# Patient Record
Sex: Female | Born: 2000 | Race: White | Hispanic: No | Marital: Single | Smoking: Never smoker
Health system: Southern US, Community
[De-identification: ages and names within clinical notes are randomized; demographics above are authoritative.]

---
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
## 2019-04-05 IMAGING — US US PELVIS LIMITED
1 series · 14 of 24 positions shown · non-contrast
Comparison: None.

CLINICAL DATA: 16-year-old female with pain and redness over the
coccyx.

EXAM:
Limited ultrasound of the soft tissues of the lower back and
coccygeal region.
TECHNIQUE: Targeted sonographic images of the soft tissues of the lower back in
the region of the coccyx using grayscale and color Doppler.

[Series 1: us pelvis limited · 0.07mm/px · 24 acquisitions, 14 frames shown]
[im 1/24]
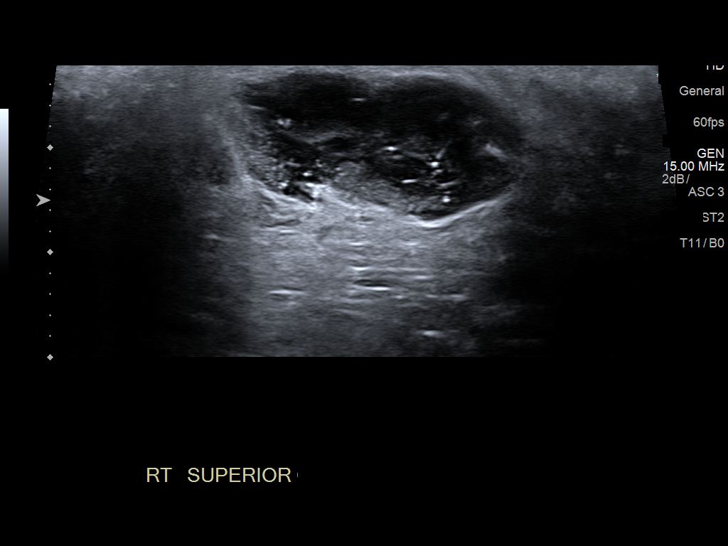
[im 3/24]
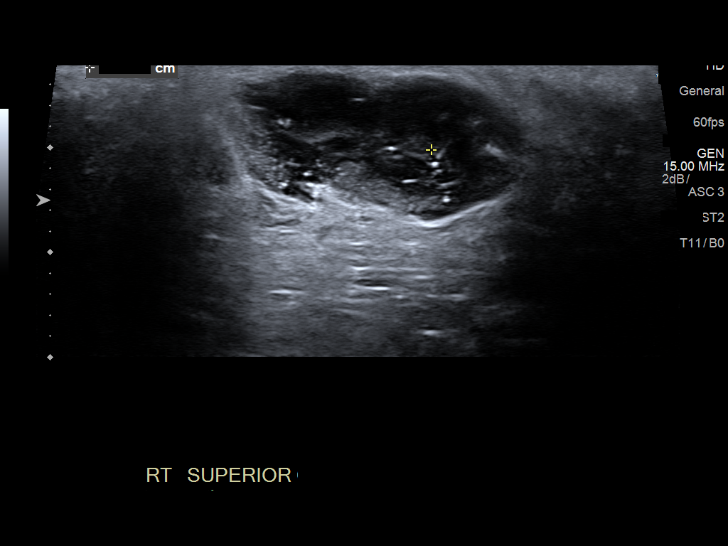
[im 5/24]
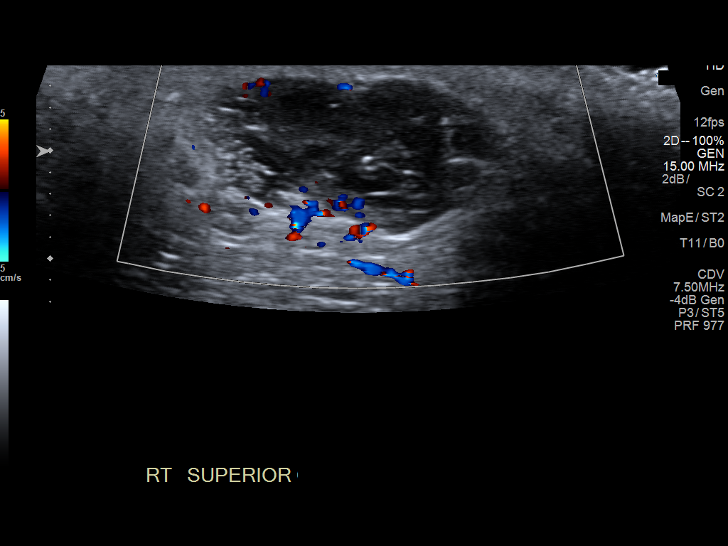
[im 7/24]
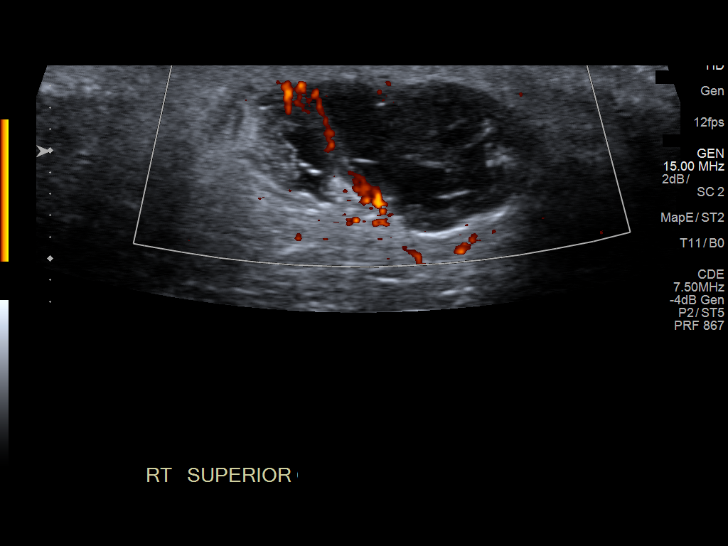
[im 8/24]
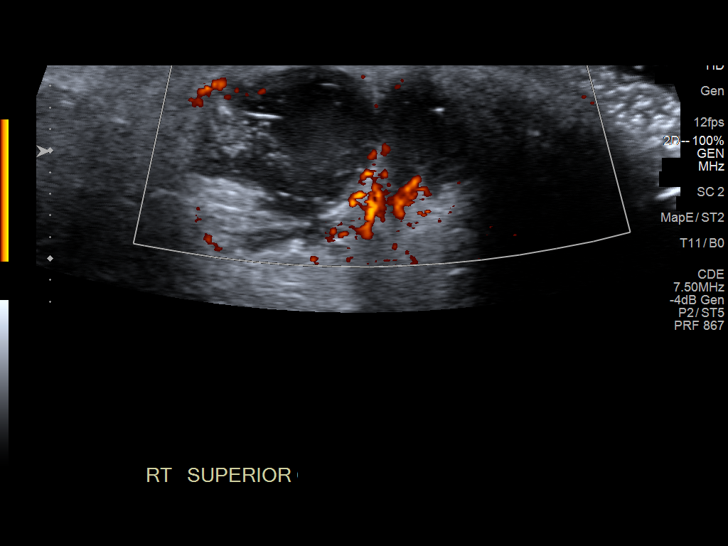
[im 10/24]
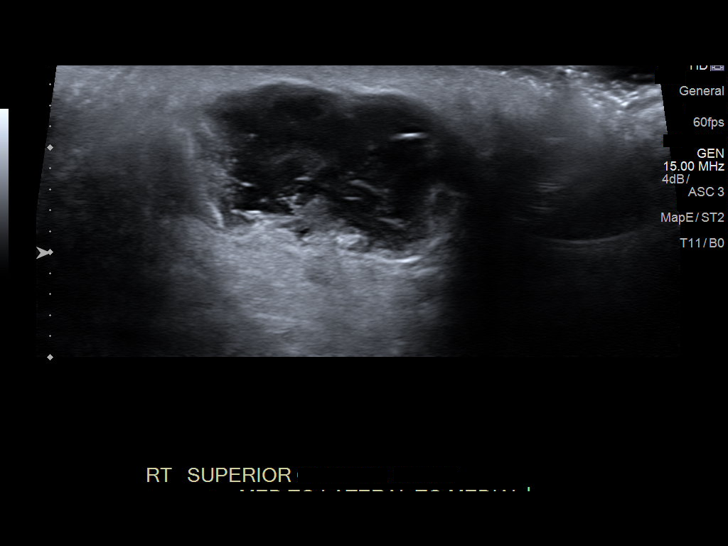
[im 12/24]
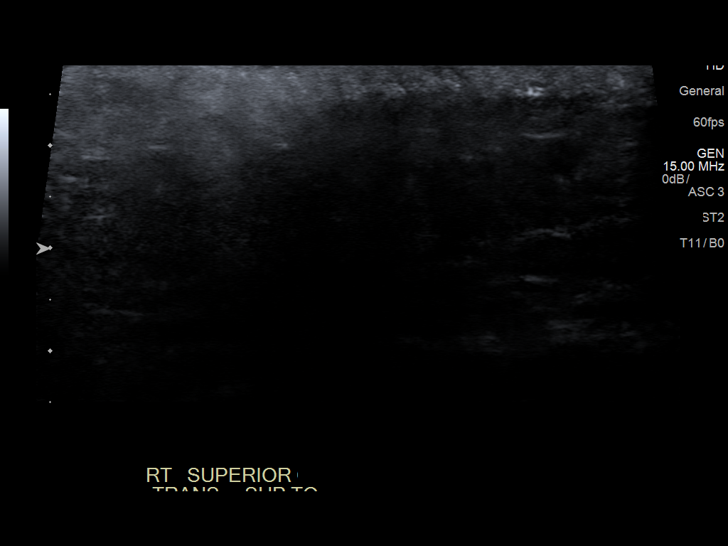
[im 13/24]
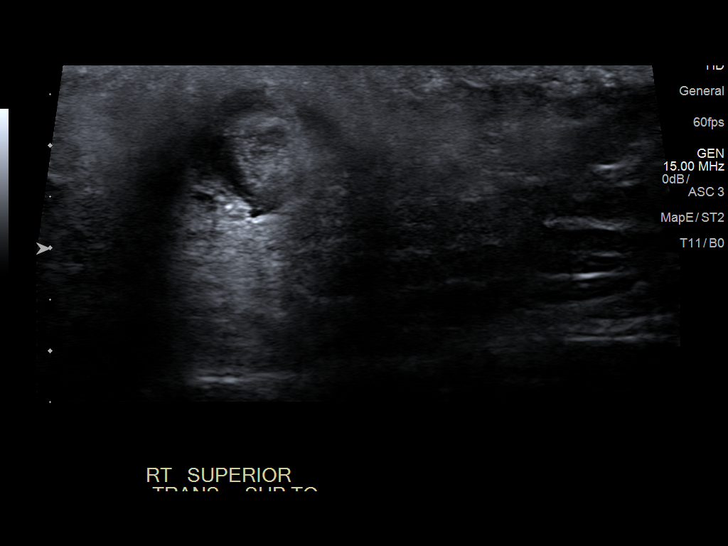
[im 15/24]
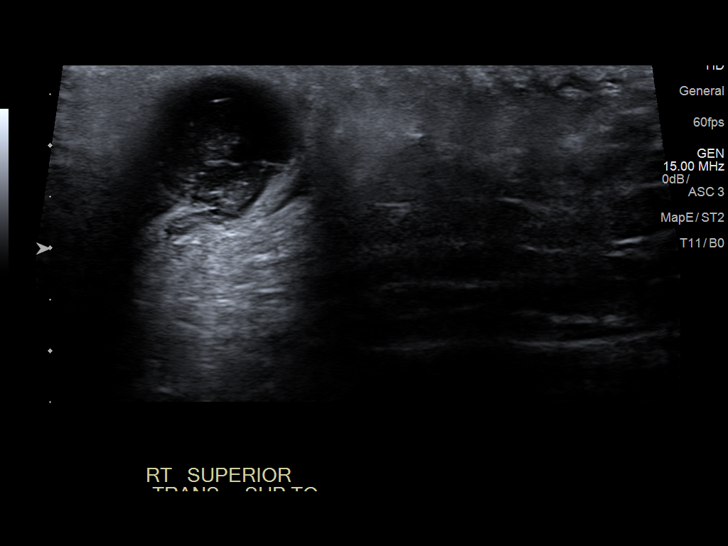
[im 17/24]
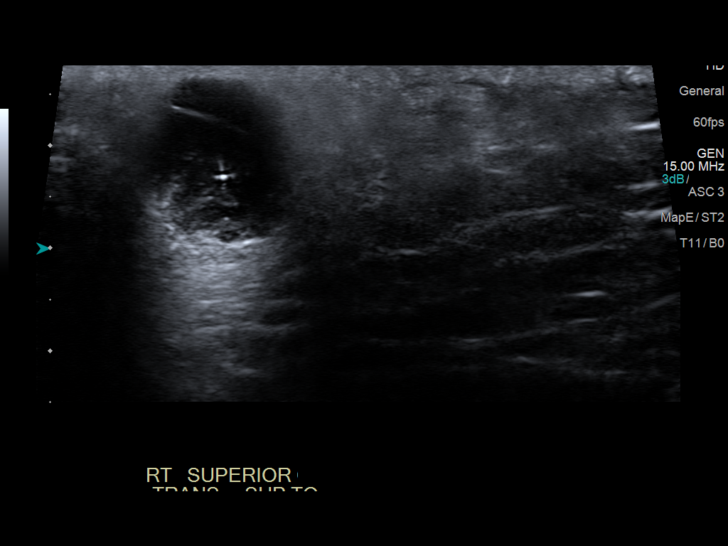
[im 19/24]
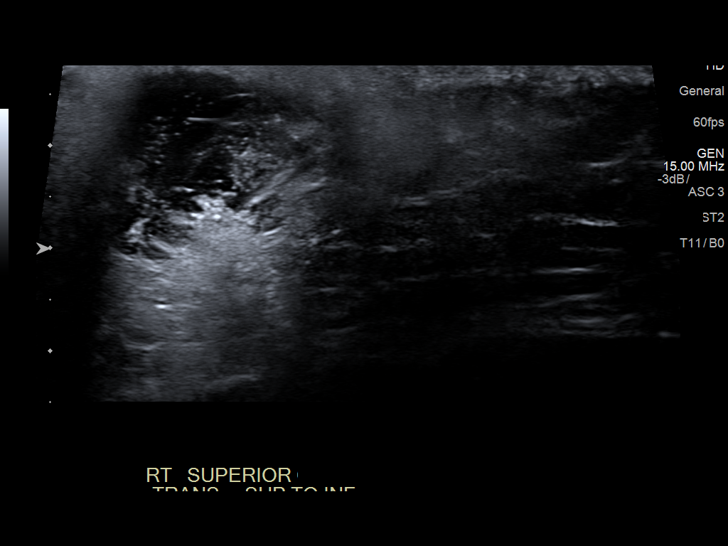
[im 20/24]
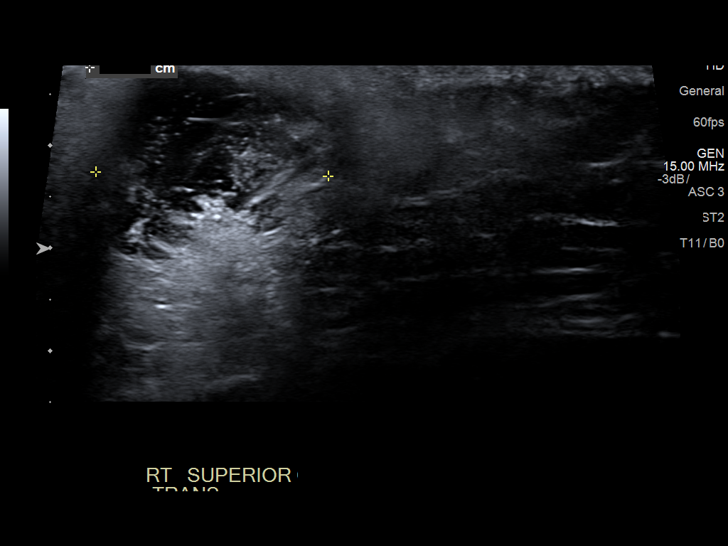
[im 22/24]
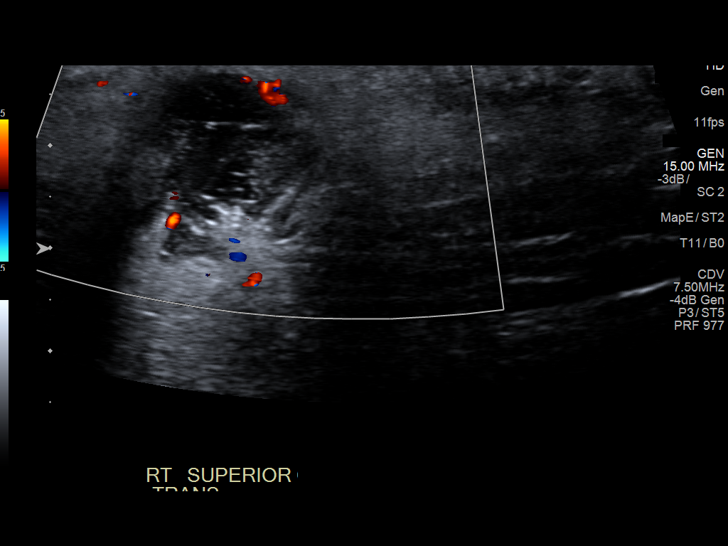
[im 24/24]
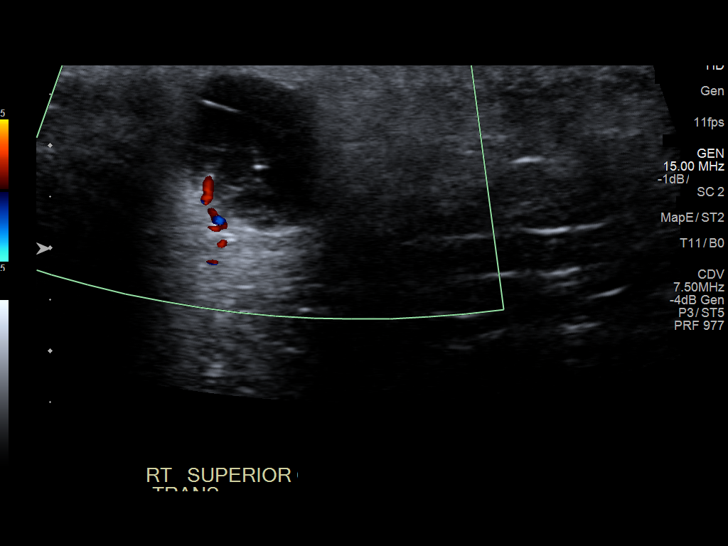

[14 of 24 positions shown; findings below may reference images not displayed]

FINDINGS: There is a 2.8 x 2.3 x 1.5 cm complex predominantly cystic structure
or complex collection in the superficial soft tissues of the lower
back superior and to the right of the to the intergluteal cleft.

Color images demonstrate hyperemia primarily to the periphery of
this collection. There is diffuse edema in the adjacent subcutaneous
soft tissues.
IMPRESSION: Complex collection in the subcutaneous soft tissues of the lower
back superior and to the right of the intergluteal fissure most
consistent with an abscess. Clinical correlation is recommended.

## 2019-04-18 DIAGNOSIS — Z03818 Encounter for observation for suspected exposure to other biological agents ruled out: Secondary | ICD-10-CM | POA: Diagnosis not present

## 2019-04-18 DIAGNOSIS — Z20828 Contact with and (suspected) exposure to other viral communicable diseases: Secondary | ICD-10-CM | POA: Diagnosis not present

## 2019-04-22 DIAGNOSIS — Z20828 Contact with and (suspected) exposure to other viral communicable diseases: Secondary | ICD-10-CM | POA: Diagnosis not present

## 2019-06-10 HISTORY — PX: OTHER SURGICAL HISTORY: SHX169

## 2019-06-16 ENCOUNTER — Other Ambulatory Visit: Payer: Self-pay

## 2019-06-16 ENCOUNTER — Ambulatory Visit (INDEPENDENT_AMBULATORY_CARE_PROVIDER_SITE_OTHER): Payer: BC Managed Care – PPO | Admitting: Obstetrics and Gynecology

## 2019-06-16 ENCOUNTER — Encounter: Payer: Self-pay | Admitting: Obstetrics and Gynecology

## 2019-06-16 VITALS — BP 110/70 | Ht 67.0 in | Wt 149.0 lb

## 2019-06-16 DIAGNOSIS — N921 Excessive and frequent menstruation with irregular cycle: Secondary | ICD-10-CM | POA: Insufficient documentation

## 2019-06-16 DIAGNOSIS — Z3009 Encounter for other general counseling and advice on contraception: Secondary | ICD-10-CM

## 2019-06-16 DIAGNOSIS — D500 Iron deficiency anemia secondary to blood loss (chronic): Secondary | ICD-10-CM | POA: Insufficient documentation

## 2019-06-16 LAB — POCT HEMOGLOBIN: Hemoglobin: 9.8 g/dL — AB (ref 11–14.6)

## 2019-06-16 NOTE — Progress Notes (Signed)
[place], [facility]   Chief Complaint  Patient presents with  . Contraception    family history of endometriosis, would like to switch BC, currently on OCP's, maybe IUD, heavy cycles and clots, a lot of cramping    HPI:      [name] is a 18 y.o. No obstetric history on file. who LMP was Patient's last menstrual period was 06/04/2019 (exact date)., presents today for NP [facility] consult. Pt has been on OCPs since 5/20. Menses are monthly, last 6 days, heavy changing super plus tampon hourly for 6 days and with large clots. Feels fatigued due to blood loss. Was anemic 5/20 with PCP, not taking iron supp. No BTB, has severe dysmen, not improved with Rx anaprox/NSAIDs. Has had to miss school/activities due to pain. Menses without OCPs last 10 days, not as heavy but cramps still severe. Pt never had dysmen until 12/19. Mom with endometriosis and pt concerned. She has had wt gain with OCPs and mood changes. Interested in different [facility] Mom had IUD and did well. No hx of HTN, DVTs, migraines.  She is sex active, using OCPs and condoms. Has pelvic and back dyspareunia.   Patient Active Problem List   Diagnosis Date Noted  . Iron deficiency anemia due to chronic blood loss 06/16/2019  . Menometrorrhagia 06/16/2019  . Pilonidal cyst with abscess 07/27/2017  . Cough 07/26/2017  . Postural dizziness with presyncope 10/20/2015  . Bronchospasm, exercise-induced 09/13/2015  . FHx: Brugada syndrome 09/13/2015    History reviewed. No pertinent surgical history.  Family History  Problem Relation Age of Onset  . Lung cancer Maternal Grandfather     Social History   Socioeconomic History  . Marital status: Unknown    Spouse name: Not on file  . Number of children: Not on file  . Years of education: Not on file  . Highest education level: Not on file  Occupational History  . Not on file  Social Needs  . Financial resource strain: Not on file  . Food insecurity    Worry: Not  on file    Inability: Not on file  . Transportation needs    Medical: Not on file    Non-medical: Not on file  Tobacco Use  . Smoking status: Never Smoker  . Smokeless tobacco: Never Used  Substance and Sexual Activity  . Alcohol use: No  . Drug use: Never  . Sexual activity: Yes    Birth control/protection: Pill  Lifestyle  . Physical activity    Days per week: Not on file    Minutes per session: Not on file  . Stress: Not on file  Relationships  . Social Musician on phone: Not on file    Gets together: Not on file    Attends religious service: Not on file    Active member of club or organization: Not on file    Attends meetings of clubs or organizations: Not on file    Relationship status: Not on file  . Intimate partner violence    Fear of current or ex partner: Not on file    Emotionally abused: Not on file    Physically abused: Not on file    Forced sexual activity: Not on file  Other Topics Concern  . Not on file  Social History Narrative  . Not on file    Outpatient Medications Prior to Visit  Medication Sig Dispense Refill  . SPRINTEC 28 0.25-35  MG-MCG tablet Take 1 tablet by mouth daily.    Marland Kitchen. docusate sodium (COLACE) 100 MG capsule Take 1 tablet once or twice daily as needed for constipation while taking narcotic pain medicine 30 capsule 0  . HYDROcodone-acetaminophen (NORCO/VICODIN) 5-325 MG tablet Take 1-2 tablets by mouth every 6 (six) hours as needed for moderate pain or severe pain. 15 tablet 0  . naproxen (NAPROSYN) 500 MG tablet Take 500 mg by mouth 2 (two) times daily with a meal.  0  . sulfamethoxazole-trimethoprim (BACTRIM DS,SEPTRA DS) 800-160 MG tablet Take 2 tablets by mouth 2 (two) times daily. 40 tablet 0   No facility-administered medications prior to visit.       ROS:  Review of Systems  Constitutional: Negative for fatigue, fever and unexpected weight change.  Respiratory: Negative for cough, shortness of breath and  wheezing.   Cardiovascular: Negative for chest pain, palpitations and leg swelling.  Gastrointestinal: Negative for blood in stool, constipation, diarrhea, nausea and vomiting.  Endocrine: Negative for cold intolerance, heat intolerance and polyuria.  Genitourinary: Positive for menstrual problem. Negative for dyspareunia, dysuria, flank pain, frequency, genital sores, hematuria, pelvic pain, urgency, vaginal bleeding, vaginal discharge and vaginal pain.  Musculoskeletal: Negative for back pain, joint swelling and myalgias.  Skin: Negative for rash.  Neurological: Negative for dizziness, syncope, light-headedness, numbness and headaches.  Hematological: Negative for adenopathy.  Psychiatric/Behavioral: Negative for agitation, confusion, sleep disturbance and suicidal ideas. The patient is not nervous/anxious.    BREAST: No symptoms   OBJECTIVE:   Vitals:  BP 110/70   Ht 5\' 7"  (1.702 m)   Wt 149 lb (67.6 kg)   LMP 06/04/2019 (Exact Date)   BMI 23.34 kg/m   Physical Exam Vitals signs reviewed.  Constitutional:      Appearance: She is well-developed.  Neck:     Musculoskeletal: Normal range of motion.  Pulmonary:     Effort: Pulmonary effort is normal.  Musculoskeletal: Normal range of motion.  Skin:    General: Skin is warm and dry.  Neurological:     General: No focal deficit present.     Mental Status: She is alert and oriented to person, place, and time.     Cranial Nerves: No cranial nerve deficit.  Psychiatric:        Mood and Affect: Mood normal.        Behavior: Behavior normal.        Thought Content: Thought content normal.        Judgment: Judgment normal.     Results: Results for orders placed or performed in visit on 06/16/19 (from the past 24 hour(s))  POCT hemoglobin     Status: Abnormal   Collection Time: 06/16/19  4:03 PM  Result Value Ref Range   Hemoglobin 9.8 (A) 11 - 14.6 g/dL     Assessment/Plan: Menometrorrhagia - Plan: POCT hemoglobin; Pt  with anemia. Sx worse on sprintec. Discussed different OCPs, depo, and IUD for cycle control. Discussed endometriosis and dx lap to dx.Would check GYN u/s first if can't get sx under control with BC. Pt to discuss options with mom and f/u with decision. Would start with next menses. NSAIDs 2-3 days before menses this cycle for dysmen improvement.   Encounter for other general counseling or advice on contraception--options discussed. [facility] F/u with menses for insertion/cytotec prn vs OCP change.   Iron deficiency anemia due to chronic blood loss--add MVI with Fe.    Return if symptoms worsen or fail to  improve.  Rayden Dock B. Sandhya Denherder, PA-C 06/16/2019 7:53 PM

## 2019-06-16 NOTE — Patient Instructions (Signed)
I value your feedback and entrusting us with your care. If you get a [place] [facility] patient survey, I would appreciate you taking the time to let us know about your experience today. Thank you! 

## 2019-06-19 ENCOUNTER — Encounter: Payer: Self-pay | Admitting: Obstetrics and Gynecology

## 2019-06-20 ENCOUNTER — Telehealth: Payer: Self-pay | Admitting: Obstetrics and Gynecology

## 2019-06-20 NOTE — Telephone Encounter (Signed)
Patient is schedule for 07/02/19 with [facility] for [facility] placement

## 2019-06-24 NOTE — Telephone Encounter (Signed)
Noted. Kyleena reserved for this patient. 

## 2019-07-02 ENCOUNTER — Other Ambulatory Visit: Payer: Self-pay | Admitting: Obstetrics and Gynecology

## 2019-07-02 ENCOUNTER — Encounter: Payer: BC Managed Care – PPO | Admitting: Obstetrics and Gynecology

## 2019-07-02 ENCOUNTER — Other Ambulatory Visit: Payer: Self-pay

## 2019-07-02 ENCOUNTER — Encounter: Payer: Self-pay | Admitting: Obstetrics and Gynecology

## 2019-07-02 DIAGNOSIS — Z30014 Encounter for initial prescription of intrauterine contraceptive device: Secondary | ICD-10-CM

## 2019-07-02 MED ORDER — MISOPROSTOL 100 MCG PO TABS: 100.0000 ug | ORAL_TABLET | Freq: Once | ORAL | 0 refills | Status: DC

## 2019-07-02 NOTE — Progress Notes (Signed)
Rx cytotec to take 1 hr before iUD appt.

## 2019-07-02 NOTE — Telephone Encounter (Signed)
Patient rescheduled to 12/30 with [facility]

## 2019-07-03 NOTE — Patient Instructions (Signed)
I value your feedback and entrusting us with your care. If you get a [facility] patient survey, I would appreciate you taking the time to let us know about your experience today. Thank you!  As of June 19, 2019, your lab results will be released to your MyChart immediately, before I even have a chance to see them. Please give me time to review them and contact you if there are any abnormalities. Thank you for your patience.  

## 2019-07-03 NOTE — Progress Notes (Signed)
This encounter was created in error - please disregard.

## 2019-07-08 NOTE — Progress Notes (Signed)
   Chief Complaint  Patient presents with  . Contraception    [name]     [name]:  Patricia Schwartz is a 18 y.o. G0P0000 here for [facility]  IUD insertion for [facility] and menorrhagia. Was on OCPs without sx control. Wanted to try IUD.   BP 110/72 (BP Location: Left Arm, Patient Position: Sitting, Cuff Size: Normal)   Pulse 92   Ht 5\' 7"  (1.702 m)   Wt 148 lb (67.1 kg)   LMP 07/02/2019   BMI 23.18 kg/m   IUD Insertion Procedure Note Patient identified, informed consent performed, consent signed.   Discussed risks of irregular bleeding, cramping, infection, malpositioning or misplacement of the IUD outside the uterus which may require further procedure such as laparoscopy, risk of failure <1%. Time out was performed.    Speculum placed in the vagina.  Cervix visualized.  Cleaned with Betadine x 2.  Grasped anteriorly with a single tooth tenaculum.  Uterus sounded to 7.0 cm.   IUD placed per manufacturer's recommendations.  Strings trimmed to 3 cm. Tenaculum was removed, good hemostasis noted.  Patient tolerated procedure well.   ASSESSMENT:  Encounter for insertion of intrauterine contraceptive device (IUD) - Plan: Levonorgestrel (KYLEENA) 19.5 MG IUD   Meds ordered this encounter  Medications  . Levonorgestrel (KYLEENA) 19.5 MG IUD    Sig: 1 each (19.5 mg total) by Intrauterine route once for 1 dose.    Dispense:  1 Intra Uterine Device    Refill:  0    Order Specific Question:   Supervising Provider    Answer:   Gae Dry [[id number]]     Plan:  Patient was given post-procedure instructions.  She was advised to have backup contraception for one week.   Call if you are having increasing pain, cramps or bleeding or if you have a fever greater than 100.4 degrees F., shaking chills, nausea or vomiting. Patient was also asked to check IUD strings periodically and follow up in 4 weeks for IUD check.  Return in about 4 weeks (around 08/06/2019) for IUD  f/u.  Mylani Gentry B. Westlee Devita, PA-C 07/09/2019 12:32 PM

## 2019-07-08 NOTE — Telephone Encounter (Signed)
Noted  

## 2019-07-09 ENCOUNTER — Encounter: Payer: Self-pay | Admitting: Obstetrics and Gynecology

## 2019-07-09 ENCOUNTER — Other Ambulatory Visit: Payer: Self-pay

## 2019-07-09 ENCOUNTER — Ambulatory Visit (INDEPENDENT_AMBULATORY_CARE_PROVIDER_SITE_OTHER): Payer: BC Managed Care – PPO | Admitting: Obstetrics and Gynecology

## 2019-07-09 VITALS — BP 110/72 | HR 92 | Ht 67.0 in | Wt 148.0 lb

## 2019-07-09 DIAGNOSIS — Z30430 Encounter for insertion of intrauterine contraceptive device: Secondary | ICD-10-CM | POA: Diagnosis not present

## 2019-07-09 MED ORDER — KYLEENA 19.5 MG IU IUD: 19.5000 mg | INTRAUTERINE_SYSTEM | Freq: Once | INTRAUTERINE | 0 refills | Status: DC

## 2019-07-09 NOTE — Patient Instructions (Signed)
I value your feedback and entrusting us with your care. If you get a [facility] patient survey, I would appreciate you taking the time to let us know about your experience today. Thank you!  As of June 19, 2019, your lab results will be released to your MyChart immediately, before I even have a chance to see them. Please give me time to review them and contact you if there are any abnormalities. Thank you for your patience.   [facility] [phone]  Instructions after IUD insertion  Most women experience no significant problems after insertion of an IUD, however minor cramping and spotting for a few days is common. Cramps may be treated with ibuprofen 800mg every 8 hours or Tylenol 650 mg every 4 hours. Contact [facility] immediately if you experience any of the following symptoms during the next week: temperature >99.6 degrees, worsening pelvic pain, abdominal pain, fainting, unusually heavy vaginal bleeding, foul vaginal discharge, or if you think you have expelled the IUD.  Nothing inserted in the vagina for 48 hours. You will be scheduled for a follow up visit in approximately four weeks.  You should check monthly to be sure you can feel the IUD strings in the upper vagina. If you are having a monthly period, try to check after each period. If you cannot feel the IUD strings,  contact [facility] immediately so we can do an exam to determine if the IUD has been expelled.   Please use backup protection until we can confirm the IUD is in place.  Call [facility] if you are exposed to or diagnosed with a sexually transmitted infection, as we will need to discuss whether it is safe for you to continue using an IUD.   

## 2019-07-15 ENCOUNTER — Encounter: Payer: Self-pay | Admitting: Obstetrics and Gynecology

## 2019-07-25 DIAGNOSIS — R05 Cough: Secondary | ICD-10-CM | POA: Diagnosis not present

## 2019-08-05 NOTE — Progress Notes (Deleted)
   No chief complaint on file.    History of Present Illness:  Patricia Schwartz is a 19 y.o. that had a Palau IUD placed approximately 1 month ago. Since that time, she denies dyspareunia, pelvic pain, non-menstrual bleeding, vaginal d/c, heavy bleeding.   Review of Systems  Physical Exam:  There were no vitals taken for this visit. There is no height or weight on file to calculate BMI.  Pelvic exam:  Two IUD strings {DESC; PRESENT/ABSENT:17923::"present"} seen coming from the cervical os. EGBUS, vaginal vault and cervix: within normal limits   Assessment:   No diagnosis found.  IUD strings present in proper location; pt doing well  Plan: F/u if any signs of infection or can no longer feel the strings.   Yailen Zemaitis B. Minie Roadcap, PA-C 08/05/2019 4:04 PM

## 2019-08-06 ENCOUNTER — Ambulatory Visit: Payer: BC Managed Care – PPO | Admitting: Obstetrics and Gynecology

## 2019-08-07 ENCOUNTER — Other Ambulatory Visit: Payer: Self-pay

## 2019-08-07 ENCOUNTER — Ambulatory Visit (INDEPENDENT_AMBULATORY_CARE_PROVIDER_SITE_OTHER): Payer: BC Managed Care – PPO | Admitting: Obstetrics and Gynecology

## 2019-08-07 ENCOUNTER — Encounter: Payer: Self-pay | Admitting: Obstetrics and Gynecology

## 2019-08-07 VITALS — BP 100/60 | Ht 66.0 in | Wt 150.0 lb

## 2019-08-07 DIAGNOSIS — Z30431 Encounter for routine checking of intrauterine contraceptive device: Secondary | ICD-10-CM | POA: Diagnosis not present

## 2019-08-07 NOTE — Patient Instructions (Signed)
I value your feedback and entrusting us with your care. If you get a  patient survey, I would appreciate you taking the time to let us know about your experience today. Thank you!  As of June 19, 2019, your lab results will be released to your MyChart immediately, before I even have a chance to see them. Please give me time to review them and contact you if there are any abnormalities. Thank you for your patience.  

## 2019-08-07 NOTE — Progress Notes (Signed)
   Chief Complaint  Patient presents with  . IUD check     History of Present Illness:  Patricia Schwartz is a 19 y.o. that had a Palau IUD placed approximately 1 month ago. Since that time, she has had almost daily light bleeding/spotting, and then had lighter menses last wk. Had mild cramping initially after insertion, but stopped after 4 days. No dyspareunia.  Review of Systems  Constitutional: Negative for fever.  Gastrointestinal: Negative for blood in stool, constipation, diarrhea, nausea and vomiting.  Genitourinary: Negative for dyspareunia, dysuria, flank pain, frequency, hematuria, urgency, vaginal bleeding, vaginal discharge and vaginal pain.  Musculoskeletal: Negative for back pain.  Skin: Negative for rash.    Physical Exam:  BP 100/60   Ht 5\' 6"  (1.676 m)   Wt 150 lb (68 kg)   BMI 24.21 kg/m  Body mass index is 24.21 kg/m.  Pelvic exam:  Two IUD strings present seen coming from the cervical os. EGBUS, vaginal vault and cervix: within normal limits   Assessment:   Encounter for routine checking of intrauterine contraceptive device (IUD)  IUD strings present in proper location; pt doing well  Plan: F/u if any signs of infection or can no longer feel the strings.   Rogene Meth B. Ezekial Arns, PA-C 08/07/2019 11:19 AM

## 2019-09-17 NOTE — Telephone Encounter (Signed)
Patricia Schwartz rcvd/charged 07/09/2019

## 2019-10-02 DIAGNOSIS — D225 Melanocytic nevi of trunk: Secondary | ICD-10-CM | POA: Diagnosis not present

## 2019-10-02 DIAGNOSIS — D224 Melanocytic nevi of scalp and neck: Secondary | ICD-10-CM | POA: Diagnosis not present

## 2019-10-02 DIAGNOSIS — L2089 Other atopic dermatitis: Secondary | ICD-10-CM | POA: Diagnosis not present

## 2019-10-11 ENCOUNTER — Encounter: Payer: Self-pay | Admitting: Obstetrics and Gynecology

## 2019-10-11 DIAGNOSIS — N921 Excessive and frequent menstruation with irregular cycle: Secondary | ICD-10-CM

## 2019-10-11 DIAGNOSIS — Z30431 Encounter for routine checking of intrauterine contraceptive device: Secondary | ICD-10-CM

## 2019-10-16 ENCOUNTER — Ambulatory Visit (INDEPENDENT_AMBULATORY_CARE_PROVIDER_SITE_OTHER): Payer: BC Managed Care – PPO

## 2019-10-16 ENCOUNTER — Other Ambulatory Visit: Payer: Self-pay

## 2019-10-16 DIAGNOSIS — R102 Pelvic and perineal pain: Secondary | ICD-10-CM | POA: Diagnosis not present

## 2019-10-16 DIAGNOSIS — Z30431 Encounter for routine checking of intrauterine contraceptive device: Secondary | ICD-10-CM | POA: Diagnosis not present

## 2019-10-16 DIAGNOSIS — Z975 Presence of (intrauterine) contraceptive device: Secondary | ICD-10-CM

## 2019-10-16 DIAGNOSIS — N921 Excessive and frequent menstruation with irregular cycle: Secondary | ICD-10-CM

## 2019-10-16 NOTE — Progress Notes (Signed)
Called pt, no answer, LVMTRC. 

## 2019-10-16 NOTE — Progress Notes (Signed)
Pt will give it more time

## 2019-12-29 ENCOUNTER — Encounter: Payer: Self-pay | Admitting: Obstetrics and Gynecology

## 2020-01-07 ENCOUNTER — Other Ambulatory Visit: Payer: Self-pay

## 2020-01-07 ENCOUNTER — Ambulatory Visit: Payer: BC Managed Care – PPO | Admitting: Obstetrics and Gynecology

## 2020-01-07 ENCOUNTER — Encounter: Payer: Self-pay | Admitting: Obstetrics and Gynecology

## 2020-01-07 ENCOUNTER — Other Ambulatory Visit (HOSPITAL_COMMUNITY)
Admission: RE | Admit: 2020-01-07 | Discharge: 2020-01-07 | Disposition: A | Discharge status: Home / Self Care | Payer: BC Managed Care – PPO | Source: Ambulatory Visit | Attending: Obstetrics and Gynecology | Admitting: Obstetrics and Gynecology

## 2020-01-07 VITALS — BP 126/67 | HR 81 | Ht 66.0 in | Wt 150.0 lb

## 2020-01-07 DIAGNOSIS — Z113 Encounter for screening for infections with a predominantly sexual mode of transmission: Secondary | ICD-10-CM | POA: Insufficient documentation

## 2020-01-07 DIAGNOSIS — Z30431 Encounter for routine checking of intrauterine contraceptive device: Secondary | ICD-10-CM | POA: Diagnosis not present

## 2020-01-07 MED ORDER — LO LOESTRIN FE 1 MG-10 MCG / 10 MCG PO TABS: 1.0000 | ORAL_TABLET | Freq: Every day | ORAL | 3 refills | Status: DC

## 2020-01-07 NOTE — Progress Notes (Signed)
Obstetrics & Gynecology Office Visit   Chief Complaint:  Chief Complaint  Patient presents with  . Menstrual Problem    Patricia Schwartz inserted 06/2019, cramping, heavy bleeding    History of Present Illness: 19 y.o. patient presenting for follow up of Patricia Schwartz placement 07/09/2019 by Levin Erp, PA.  The indication for her Schwartz was contraception.  She admits to any complications since her Schwartz placement.  Still having some occasional spotting, significant cramping.  She is able to feel strings.  Placement has been previously confirmed by [facility] 10/16/2019  Review of Systems: Review of Systems  Constitutional: Negative.   Gastrointestinal: Positive for abdominal pain. Negative for constipation, diarrhea, nausea and vomiting.  Genitourinary: Negative.     Past Medical History:  Past Medical History:  Diagnosis Date  . Anemia 11/2018  . Pilonidal abscess 2019    Past Surgical History:  Past Surgical History:  Procedure Laterality Date  . Patricia insertion  06/2019    Gynecologic History: No LMP recorded. (Menstrual status: Schwartz).  Obstetric History: G0P0000  Family History:  Family History  Problem Relation Age of Onset  . Lung cancer Maternal Grandfather     Social History:  Social History   Socioeconomic History  . Marital status: Unknown    Spouse name: Not on file  . Number of children: Not on file  . Years of education: Not on file  . Highest education level: Not on file  Occupational History  . Not on file  Tobacco Use  . Smoking status: Never Smoker  . Smokeless tobacco: Never Used  Vaping Use  . Vaping Use: Never used  Substance and Sexual Activity  . Alcohol use: No  . Drug use: Never  . Sexual activity: Yes    Birth control/protection: I.U.D.    Comment: Patricia  Other Topics Concern  . Not on file  Social History Narrative  . Not on file   Social Determinants of Health   Financial Resource Strain:   . Difficulty of Paying Living  Expenses:   Food Insecurity:   . Worried About Programme researcher, broadcasting/film/video in the Last Year:   . Barista in the Last Year:   Transportation Needs:   . Freight forwarder (Medical):   Marland Kitchen Lack of Transportation (Non-Medical):   Physical Activity:   . Days of Exercise per Week:   . Minutes of Exercise per Session:   Stress:   . Feeling of Stress :   Social Connections:   . Frequency of Communication with Friends and Family:   . Frequency of Social Gatherings with Friends and Family:   . Attends Religious Services:   . Active Member of Clubs or Organizations:   . Attends Banker Meetings:   Marland Kitchen Marital Status:   Intimate Partner Violence:   . Fear of Current or Ex-Partner:   . Emotionally Abused:   Marland Kitchen Physically Abused:   . Sexually Abused:     Allergies:  No Known Allergies  Medications: Prior to Admission medications   Medication Sig Start Date End Date Taking? Authorizing Provider  albuterol (VENTOLIN HFA) 108 (90 Base) MCG/ACT inhaler Inhale 2 puffs into the lungs. 07/25/19   [provider]  Levonorgestrel (Patricia) 19.5 MG Schwartz 1 each (19.5 mg total) by Intrauterine route once for 1 dose. 07/09/19 07/09/19  Copland, Ilona Sorrel, PA-C    Physical Exam There were no vitals taken for this visit. No LMP recorded. (Menstrual status: Schwartz).  General: NAD HEENT: normocephalic, anicteric Pulmonary: No increased work of breathing  Genitourinary:  External: Normal external female genitalia.  Normal urethral meatus, normal  Bartholin's and Skene's glands.    Vagina: Normal vaginal mucosa, no evidence of prolapse.    Cervix: Grossly normal in appearance, no bleeding, Schwartz strings visualized 2cm  Uterus: Non-enlarged, mobile, normal contour.  No CMT  Adnexa: ovaries non-enlarged, no adnexal masses  Rectal: deferred  Lymphatic: no evidence of inguinal lymphadenopathy Extremities: no edema, erythema, or tenderness Neurologic: Grossly intact Psychiatric: mood  appropriate, affect full  Female chaperone present for pelvic and breast  portions of the physical exam  No results found.   GYNECOLOGY OFFICE PROCEDURE NOTE  Patricia Schwartz is a 19 y.o. G0P0000 here for Mirena Schwartz removal secondary to continued pelvic pain and discomfort.  Schwartz Removal  Patient identified, informed consent performed, consent signed.  Patient was in the dorsal lithotomy position, normal external genitalia was noted.  A speculum was placed in the patient's vagina, normal discharge was noted, no lesions. The cervix was visualized, no lesions, no abnormal discharge.  The strings of the Schwartz were grasped and pulled using ring forceps. The Schwartz was removed in its entirety.  Patient tolerated the procedure well.    Patient will useOCP for contraception.  Routine preventative health maintenance measures emphasized.  Assessment: 19 y.o. G0P0000 Schwartz removal initiation of oral contraceptive  Plan: Problem List Items Addressed This Visit    None    Visit Diagnoses    Schwartz check up    -  Primary   Routine screening for STI (sexually transmitted infection)       Relevant Orders   Cervicovaginal ancillary only (Completed)       1.  Schwartz removed given continued irregular bleeding and pelvic pain.  Patient opts to change to OCP 2 month sample of LoLestrin.   - GC/CT culture - mom has history of stage IV endometriosis - no contraindication to OCP  2.  She will return for a annual exam in 1 year.  All questions answered.  3. A total of 15 minutes were spent in face-to-face contact with the patient during this encounter with over half of that time devoted to counseling and coordination of care.  4. Return in about 6 months (around 07/08/2020) for annual.   Vena Austria, MD, [facility], [facility] 01/07/2020, 8:17 AM

## 2020-01-08 LAB — CERVICOVAGINAL ANCILLARY ONLY
Chlamydia: NEGATIVE
Comment: NEGATIVE
Comment: NEGATIVE
Comment: NORMAL
Neisseria Gonorrhea: NEGATIVE
Trichomonas: NEGATIVE

## 2020-02-10 ENCOUNTER — Telehealth: Payer: Self-pay

## 2020-02-10 NOTE — Telephone Encounter (Signed)
Pt calling; was given samples of Lo Estrin; would like rx called in to [facility] [place].  [phone]  Left detailed msg that a prescription has already been sent in.  To contact pharm and have them get it ready for her.

## 2020-02-27 ENCOUNTER — Telehealth: Payer: Self-pay

## 2020-02-27 NOTE — Telephone Encounter (Signed)
Pt called stating she has new insurance and her OCP is going to cost her $550. She is asking for samples until she can get the insurance stuff straightened out.   2 months of Lo Loestrin was given

## 2020-03-04 DIAGNOSIS — J358 Other chronic diseases of tonsils and adenoids: Secondary | ICD-10-CM | POA: Diagnosis not present

## 2020-03-04 DIAGNOSIS — J351 Hypertrophy of tonsils: Secondary | ICD-10-CM | POA: Diagnosis not present

## 2020-03-19 ENCOUNTER — Telehealth: Payer: Self-pay

## 2020-03-19 NOTE — Telephone Encounter (Signed)
Patient returning missed call. Please advise  

## 2020-03-19 NOTE — Telephone Encounter (Signed)
Pt states no missed/late pills; is about to start the 3rd wk of this pack; states she did not have a period nor any sign of a period sxs in July or August; adv the lining didn't grow so there wasn't anything to have a period with.  Adv will send this to [facility].

## 2020-03-19 NOTE — Telephone Encounter (Signed)
Pt calling; IUD removed in June; has questions for [facility]MS; has had no period until naw and it's not her period week.  [phone]  LMTC  Pt now on OCPs; Late taking any or any missed pills?

## 2020-06-28 DIAGNOSIS — Z1331 Encounter for screening for depression: Secondary | ICD-10-CM | POA: Diagnosis not present

## 2020-06-28 DIAGNOSIS — F411 Generalized anxiety disorder: Secondary | ICD-10-CM | POA: Diagnosis not present

## 2020-06-28 DIAGNOSIS — Z1389 Encounter for screening for other disorder: Secondary | ICD-10-CM | POA: Diagnosis not present

## 2020-09-22 ENCOUNTER — Ambulatory Visit: Payer: BC Managed Care – PPO | Admitting: Obstetrics and Gynecology

## 2020-09-22 NOTE — Progress Notes (Deleted)
Gynecology Annual Exam  PCP: [facility]  Chief Complaint: No chief complaint on file.   History of Present Illness: Patient is a 20 y.o. G0P0000 presents for annual exam. The patient has no complaints today.   LMP: No LMP recorded. (Menstrual status: IUD). Menarche:{numbers 1-61:09604} Average Interval: {Desc; regular/irreg:14544}, {numbers 22-35:14824} days Duration of flow: {numbers; 0-10:33138} days Heavy Menses: {yes/no:63} Clots: {yes/no:63} Intermenstrual Bleeding: {yes/no:63} Postcoital Bleeding: {yes/no:63} Dysmenorrhea: {yes/no:63}  The patient {sys sexually active:13135} sexually active. She currently uses OCP (estrogen/progesterone) for contraception. She {has/denies:315300} dyspareunia.  The patient {DOES_DOES VWU:98119} perform self breast exams.  There {is/is no:19420} notable family history of breast or ovarian cancer in her family.  The patient wears seatbelts: {yes/no:63}.  The patient has regular exercise: {yes/no/not asked:9010}.    The patient {Blank single:19197::"repots","denies"} current symptoms of depression.    Review of Systems: Review of Systems  Constitutional: Negative for chills and fever.  HENT: Negative for congestion.   Respiratory: Negative for cough and shortness of breath.   Cardiovascular: Negative for chest pain and palpitations.  Gastrointestinal: Negative for abdominal pain, constipation, diarrhea, heartburn, nausea and vomiting.  Genitourinary: Negative for dysuria, frequency and urgency.  Skin: Negative for itching and rash.  Neurological: Negative for dizziness and headaches.  Endo/Heme/Allergies: Negative for polydipsia.  Psychiatric/Behavioral: Negative for depression.    Past Medical History:  Patient Active Problem List   Diagnosis Date Noted  . Iron deficiency anemia due to chronic blood loss 06/16/2019  . Menometrorrhagia 06/16/2019  . Pilonidal cyst with abscess 07/27/2017  . Cough 07/26/2017  .  Postural dizziness with presyncope 10/20/2015  . Bronchospasm, exercise-induced 09/13/2015  . FHx: Brugada syndrome 09/13/2015    Past Surgical History:  Past Surgical History:  Procedure Laterality Date  . Kyleena insertion  06/2019    Gynecologic History:  No LMP recorded. (Menstrual status: IUD). Contraception: OCP (estrogen/progesterone) Last Pap: Results were: N/A <21  Obstetric History: G0P0000  Family History:  Family History  Problem Relation Age of Onset  . Lung cancer Maternal Grandfather     Social History:  Social History   Socioeconomic History  . Marital status: Unknown    Spouse name: Not on file  . Number of children: Not on file  . Years of education: Not on file  . Highest education level: Not on file  Occupational History  . Not on file  Tobacco Use  . Smoking status: Never Smoker  . Smokeless tobacco: Never Used  Vaping Use  . Vaping Use: Never used  Substance and Sexual Activity  . Alcohol use: No  . Drug use: Never  . Sexual activity: Yes    Birth control/protection: I.U.D.    Comment: Kyleena  Other Topics Concern  . Not on file  Social History Narrative  . Not on file   Social Determinants of Health   Financial Resource Strain: Not on file  Food Insecurity: Not on file  Transportation Needs: Not on file  Physical Activity: Not on file  Stress: Not on file  Social Connections: Not on file  Intimate Partner Violence: Not on file    Allergies:  No Known Allergies  Medications: Prior to Admission medications   Medication Sig Start Date End Date Taking? Authorizing Provider  albuterol (VENTOLIN HFA) 108 (90 Base) MCG/ACT inhaler Inhale 2 puffs into the lungs. 07/25/19   [provider]  Norethindrone-Ethinyl Estradiol-Fe Biphas (LO LOESTRIN FE) 1 MG-10 MCG / 10 MCG tablet Take 1 tablet by mouth  daily. 01/07/20 03/31/20  Vena Austria, MD    Physical Exam Vitals: ***  General: NAD HEENT: normocephalic,  anicteric Thyroid: no enlargement, no palpable nodules Pulmonary: No increased work of breathing, CTAB Cardiovascular: RRR, distal pulses 2+ Breast: Breast symmetrical, no tenderness, no palpable nodules or masses, no skin or nipple retraction present, no nipple discharge.  No axillary or supraclavicular lymphadenopathy. Abdomen: NABS, soft, non-tender, non-distended.  Umbilicus without lesions.  No hepatomegaly, splenomegaly or masses palpable. No evidence of hernia  Genitourinary:  External: Normal external female genitalia.  Normal urethral meatus, normal Bartholin's and Skene's glands.    Vagina: Normal vaginal mucosa, no evidence of prolapse.    Cervix: Grossly normal in appearance, no bleeding  Uterus: Non-enlarged, mobile, normal contour.  No CMT  Adnexa: ovaries non-enlarged, no adnexal masses  Rectal: deferred  Lymphatic: no evidence of inguinal lymphadenopathy Extremities: no edema, erythema, or tenderness Neurologic: Grossly intact Psychiatric: mood appropriate, affect full  Female chaperone present for pelvic and breast  portions of the physical exam    Assessment: 20 y.o. G0P0000 routine annual exam  Plan: Problem List Items Addressed This Visit   None     1) 4) Gardasil Series discussed and if applicable offered to patient - Patient {HAS HAS BOF:75102} previously completed 3 shot series   2) STI screening  {Blank single:19197::"was","was not"}offered and {Blank single:19197::"accepted","declined","therefore not obtained"}  3)  ASCCP guidelines and rational discussed.  Start routine screening age 67, every three years.  4) Contraception - the patient is currently using  OCP (estrogen/progesterone).  She is {Blank single:19197::"happy with her current form of contraception and plans to continue","interested in changing to ***","interested in starting Contraception: ***","not currently in need of contraception secondary to being sterile","attempting to conceive in the  near future"} We discussed safe sex practices to reduce her furture risk of STI's.    5) No follow-ups on file.   Vena Austria, MD, [facility], [facility] 09/22/2020, 1:24 PM

## 2020-10-08 ENCOUNTER — Ambulatory Visit: Payer: BC Managed Care – PPO | Admitting: Obstetrics and Gynecology

## 2020-11-18 DIAGNOSIS — D225 Melanocytic nevi of trunk: Secondary | ICD-10-CM | POA: Diagnosis not present

## 2020-12-28 ENCOUNTER — Telehealth: Payer: Self-pay

## 2020-12-28 NOTE — Telephone Encounter (Signed)
Pt calling to see what her next step should be to help get her periods under control; has 2-3 days of unbearable pain each month; has tried pills and IUD.  [phone]

## 2020-12-29 NOTE — Telephone Encounter (Signed)
Called and left voicemail for patient to call back to be scheduled. 

## 2021-01-26 ENCOUNTER — Other Ambulatory Visit (HOSPITAL_COMMUNITY)
Admission: RE | Admit: 2021-01-26 | Discharge: 2021-01-26 | Disposition: A | Discharge status: Home / Self Care | Payer: Self-pay | Source: Ambulatory Visit | Attending: Obstetrics and Gynecology | Admitting: Obstetrics and Gynecology

## 2021-01-26 ENCOUNTER — Other Ambulatory Visit: Payer: Self-pay

## 2021-01-26 ENCOUNTER — Ambulatory Visit (INDEPENDENT_AMBULATORY_CARE_PROVIDER_SITE_OTHER): Payer: BC Managed Care – PPO | Admitting: Obstetrics and Gynecology

## 2021-01-26 ENCOUNTER — Encounter: Payer: Self-pay | Admitting: Obstetrics and Gynecology

## 2021-01-26 VITALS — BP 112/74 | HR 84 | Ht 67.0 in | Wt 170.0 lb

## 2021-01-26 DIAGNOSIS — N939 Abnormal uterine and vaginal bleeding, unspecified: Secondary | ICD-10-CM

## 2021-01-26 DIAGNOSIS — R5383 Other fatigue: Secondary | ICD-10-CM

## 2021-01-26 DIAGNOSIS — Z01419 Encounter for gynecological examination (general) (routine) without abnormal findings: Secondary | ICD-10-CM

## 2021-01-26 DIAGNOSIS — N946 Dysmenorrhea, unspecified: Secondary | ICD-10-CM

## 2021-01-26 DIAGNOSIS — R233 Spontaneous ecchymoses: Secondary | ICD-10-CM

## 2021-01-26 DIAGNOSIS — R238 Other skin changes: Secondary | ICD-10-CM

## 2021-01-26 DIAGNOSIS — Z113 Encounter for screening for infections with a predominantly sexual mode of transmission: Secondary | ICD-10-CM

## 2021-01-26 MED ORDER — TRANEXAMIC ACID 650 MG PO TABS: 1300.0000 mg | ORAL_TABLET | Freq: Three times a day (TID) | ORAL | 2 refills | Status: DC

## 2021-01-26 NOTE — Progress Notes (Signed)
Gynecology Annual Exam  PCP: [facility]  Chief Complaint:  Chief Complaint  Patient presents with   Annual Exam    Heavy periods and cramping    History of Present Illness: Patient is a 20 y.o. G0P0000 presents for annual exam. The patient has no complaints today.   LMP: Patient's last menstrual period was 12/27/2020.  Significant dysmenorrhea since d/c OCP.  Mom has history of stage IV endometriosis diagnosed at age 68.  Currently has menses lasting up to 9 days, heavy and cramping.  The patient is not sexually active. She currently uses abstinence for contraception. She denies dyspareunia.  The patient does not perform self breast exams.  There is no notable family history of breast or ovarian cancer in her family.  The patient wears seatbelts: yes.  The patient has regular exercise: not asked.    The patient denies current symptoms of depression.    Review of Systems: Review of Systems  Constitutional:  Negative for chills and fever.  HENT:  Negative for congestion.   Respiratory:  Negative for cough and shortness of breath.   Cardiovascular:  Negative for chest pain and palpitations.  Gastrointestinal:  Negative for abdominal pain, constipation, diarrhea, heartburn, nausea and vomiting.  Genitourinary:  Negative for dysuria, frequency and urgency.  Skin:  Negative for itching and rash.  Neurological:  Negative for dizziness and headaches.  Endo/Heme/Allergies:  Negative for polydipsia.  Psychiatric/Behavioral:  Negative for depression.    Past Medical History:  Patient Active Problem List   Diagnosis Date Noted   Iron deficiency anemia due to chronic blood loss 06/16/2019   Menometrorrhagia 06/16/2019   Pilonidal cyst with abscess 07/27/2017   Cough 07/26/2017   Postural dizziness with presyncope 10/20/2015   Bronchospasm, exercise-induced 09/13/2015   FHx: Brugada syndrome 09/13/2015    Past Surgical History:  Past Surgical History:   Procedure Laterality Date   Kyleena insertion  06/2019    Gynecologic History:  Patient's last menstrual period was 12/27/2020. Contraception: none Last Pap: Results were: N/A <21  Obstetric History: G0P0000  Family History:  Family History  Problem Relation Age of Onset   Lung cancer Maternal Grandfather     Social History:  Social History   Socioeconomic History   Marital status: Unknown    Spouse name: Not on file   Number of children: Not on file   Years of education: Not on file   Highest education level: Not on file  Occupational History   Not on file  Tobacco Use   Smoking status: Never   Smokeless tobacco: Never  Vaping Use   Vaping Use: Never used  Substance and Sexual Activity   Alcohol use: No   Drug use: Never   Sexual activity: Yes    Birth control/protection: Condom    Comment: Kyleena  Other Topics Concern   Not on file  Social History Narrative   Not on file   Social Determinants of Health   Financial Resource Strain: Not on file  Food Insecurity: Not on file  Transportation Needs: Not on file  Physical Activity: Not on file  Stress: Not on file  Social Connections: Not on file  Intimate Partner Violence: Not on file    Allergies:  No Known Allergies  Medications: Prior to Admission medications   Medication Sig Start Date End Date Taking? Authorizing Provider  FLUoxetine (PROZAC) 20 MG capsule Take 20 mg by mouth every morning. 11/13/20  Yes [provider]  Norethindrone-Ethinyl Estradiol-Fe Biphas (LO LOESTRIN FE) 1 MG-10 MCG / 10 MCG tablet Take 1 tablet by mouth daily. 01/07/20 03/31/20  Vena Austria, MD    Physical Exam Vitals: Blood pressure 112/74, pulse 84, height 5\' 7"  (1.702 m), weight 170 lb (77.1 kg), last menstrual period 12/27/2020.  General: NAD HEENT: normocephalic, anicteric Pulmonary: No increased work of breathing, CTAB Abdomen: soft, non-tender, non-distended.  Umbilicus without lesions.  No  hepatomegaly, splenomegaly or masses palpable. No evidence of hernia  Genitourinary:  External: Normal external female genitalia.  Normal urethral meatus, normal Bartholin's and Skene's glands.    Vagina: Normal vaginal mucosa, no evidence of prolapse.    Uterus: Non-enlarged, mobile, normal contour.  No CMT  Adnexa: ovaries non-enlarged, no adnexal masses  Rectal: deferred  Lymphatic: no evidence of inguinal lymphadenopathy Extremities: no edema, erythema, or tenderness Neurologic: Grossly intact Psychiatric: mood appropriate, affect full  Female chaperone present for pelvic and breast  portions of the physical exam  Immunization History  Administered Date(s) Administered   Hepatitis A, Ped/Adol-2 Dose 07/21/2014, 01/25/2015   Influenza, Seasonal, Injecte, Preservative Fre 06/12/2013   Influenza,inj,Quad PF,6+ Mos 06/08/2014, 05/17/2015, 09/04/2016, 04/02/2017     Assessment: 19 y.o. G0P0000 routine annual exam  Plan: Problem List Items Addressed This Visit   None Visit Diagnoses     Encounter for gynecological examination without abnormal finding    -  Primary   Abnormal uterine bleeding       Relevant Orders   04/04/2017 PELVIS TRANSVAGINAL NON-OB (TV ONLY)   17-Hydroxyprogesterone   Androstenedione   DHEA-sulfate   FSH/LH   Testosterone,Free and Total   Prolactin   TSH   Von Willebrand panel   Dysmenorrhea       Relevant Orders   US PELVIS TRANSVAGINAL NON-OB (TV ONLY)   17-Hydroxyprogesterone   Androstenedione   DHEA-sulfate   FSH/LH   Testosterone,Free and Total   Prolactin   TSH   Von Willebrand panel   Easy bruising       Relevant Orders   Von Willebrand panel   Other fatigue       Relevant Orders   TSH   Routine screening for STI (sexually transmitted infection)       Relevant Orders   Cervicovaginal ancillary only       1) STI screening  wasoffered and accepted  2)  ASCCP guidelines and rational discussed.  Start at age 25.  3) Contraception -  the patient is currently using  abstinence.  She is happy with her current form of contraception and plans to continue We discussed safe sex practices to reduce her furture risk of STI's.   - would like to avoid hormones if at all possible - discussed ponstel and lysteda.  Trial of lysteda. - if inadequate response discussed Orilissa vs diagnostic laparoscopy to evaluate for endometriosis - AUB work up started per [facility] system  4) Return in about 6 years (around 01/27/2027) for medication/ultrasound follow up phone or in person 6 weeks.   01/29/2027, MD, Vena Austria [facility], [facility] 01/26/2021, 2:08 PM

## 2021-01-28 LAB — CERVICOVAGINAL ANCILLARY ONLY
Chlamydia: NEGATIVE
Comment: NEGATIVE
Comment: NORMAL
Neisseria Gonorrhea: NEGATIVE

## 2021-02-05 LAB — PROLACTIN: Prolactin: 12.3 ng/mL (ref 4.8–23.3)

## 2021-02-05 LAB — COAG STUDIES INTERP REPORT

## 2021-02-05 LAB — TESTOSTERONE,FREE AND TOTAL
Testosterone, Free: 2.8 pg/mL (ref 0.0–4.2)
Testosterone: 37 ng/dL (ref 13–71)

## 2021-02-05 LAB — FSH/LH
FSH: 1.6 m[IU]/mL
LH: 6.2 m[IU]/mL

## 2021-02-05 LAB — VON WILLEBRAND PANEL
Factor VIII Activity: 50 % — ABNORMAL LOW (ref 56–140)
Von Willebrand Ag: 46 % — ABNORMAL LOW (ref 50–200)
Von Willebrand Factor: 34 % — ABNORMAL LOW (ref 50–200)

## 2021-02-05 LAB — 17-HYDROXYPROGESTERONE: 17-Hydroxyprogesterone: 251 ng/dL

## 2021-02-05 LAB — DHEA-SULFATE: DHEA-SO4: 371 ug/dL (ref 110.0–431.7)

## 2021-02-05 LAB — ANDROSTENEDIONE: Androstenedione: 230 ng/dL (ref 41–262)

## 2021-02-05 LAB — TSH: TSH: 0.708 u[IU]/mL (ref 0.450–4.500)

## 2021-02-28 ENCOUNTER — Other Ambulatory Visit: Payer: Self-pay | Admitting: Obstetrics and Gynecology

## 2021-02-28 DIAGNOSIS — D6800 Von Willebrand disease, unspecified: Secondary | ICD-10-CM

## 2021-02-28 MED ORDER — TRANEXAMIC ACID 650 MG PO TABS: 1300.0000 mg | ORAL_TABLET | Freq: Three times a day (TID) | ORAL | 3 refills | Status: AC

## 2021-03-11 DIAGNOSIS — D6800 Von Willebrand disease, unspecified: Secondary | ICD-10-CM | POA: Insufficient documentation

## 2021-03-11 NOTE — Progress Notes (Signed)
[facility]  Telephone:([phone] Fax:([phone]  ID: Patricia Schwartz OB: September 07, 2000  MR#: [id number]  XVQ#:[id number]  Patient Care Team: [facility] as PCP - General  CHIEF COMPLAINT: Possible Von Willebrand's disease.  INTERVAL HISTORY: Patient is a 20 year old female who was noted to have mildly decreased von Willebrand's factor on work-up for heavy menses.  She is referred for further evaluation.  Currently, she feels well and is asymptomatic.  She has no neurologic complaints.  She denies any recent fevers or illnesses.  She has a good appetite and denies weight loss.  She has no chest pain, shortness of breath, cough, or hemoptysis.  She has no nausea, vomiting, constipation, or diarrhea.  She has no urinary complaints.  Patient feels at her baseline and offers no further specific complaints today.  REVIEW OF SYSTEMS:   Review of Systems  Constitutional: Negative.  Negative for fever, malaise/fatigue and weight loss.  Respiratory: Negative.  Negative for cough, hemoptysis and shortness of breath.   Cardiovascular: Negative.  Negative for chest pain and leg swelling.  Gastrointestinal: Negative.  Negative for abdominal pain.  Genitourinary: Negative.   Musculoskeletal: Negative.  Negative for back pain.  Skin: Negative.  Negative for rash.  Neurological: Negative.  Negative for dizziness, focal weakness, weakness and headaches.  Endo/Heme/Allergies:  Bruises/bleeds easily.  Psychiatric/Behavioral: Negative.  The patient is not nervous/anxious.    As per HPI. Otherwise, a complete review of systems is negative.  PAST MEDICAL HISTORY: Past Medical History:  Diagnosis Date   Anemia 11/2018   Pilonidal abscess 2019    PAST SURGICAL HISTORY: Past Surgical History:  Procedure Laterality Date   Kyleena insertion  06/2019    FAMILY HISTORY: Family History  Problem Relation Age of Onset   Lung cancer Maternal Grandfather      ADVANCED DIRECTIVES (Y/N):  N  HEALTH MAINTENANCE: Social History   Tobacco Use   Smoking status: Never   Smokeless tobacco: Never  Vaping Use   Vaping Use: Never used  Substance Use Topics   Alcohol use: No   Drug use: Never     Colonoscopy:  PAP:  Bone density:  Lipid panel:  No Known Allergies  Current Outpatient Medications  Medication Sig Dispense Refill   FLUoxetine (PROZAC) 20 MG capsule Take 20 mg by mouth every morning.     Norethindrone-Ethinyl Estradiol-Fe Biphas (LO LOESTRIN FE) 1 MG-10 MCG / 10 MCG tablet Take 1 tablet by mouth daily. 84 tablet 3   No current facility-administered medications for this visit.    OBJECTIVE: Vitals:   03/17/21 1458  BP: 122/73  Pulse: 73  Resp: 20  Temp: 97.8 F (36.6 C)  SpO2: 100%     Body mass index is 24.84 kg/m.    ECOG FS:0 - Asymptomatic  General: Well-developed, well-nourished, no acute distress. Eyes: Pink conjunctiva, anicteric sclera. HEENT: Normocephalic, moist mucous membranes. Lungs: No audible wheezing or coughing. Heart: Regular rate and rhythm. Abdomen: Soft, nontender, no obvious distention. Musculoskeletal: No edema, cyanosis, or clubbing. Neuro: Alert, answering all questions appropriately. Cranial nerves grossly intact. Skin: No rashes or petechiae noted. Psych: Normal affect. Lymphatics: No cervical, calvicular, axillary or inguinal LAD.   LAB RESULTS:  No results found for: NA, K, CL, CO2, GLUCOSE, BUN, CREATININE, CALCIUM, PROT, ALBUMIN, AST, ALT, ALKPHOS, BILITOT, GFRNONAA, GFRAA  Lab Results  Component Value Date   WBC 6.1 03/17/2021   NEUTROABS 4.0 03/17/2021   HGB 12.2 03/17/2021   HCT 37.2 03/17/2021  MCV 83.8 03/17/2021   PLT 351 03/17/2021     STUDIES: No results found.  ASSESSMENT: Possible Von Willebrand's disease.  PLAN:    Possible Von Willebrand's disease: Patient's von Willebrand panel was mildly decreased, but does not meet criteria for overt von  Willebrand's disease.  It is possible her levels are decreased secondary to active bleeding having menses at that time, but patient reports her laboratory work occurred midcycle.  Repeat laboratory work from today is pending at time of dictation.  PT/PTT, platelets, and PFA are all either negative or within normal limits.  Patient has no other obvious reason for excessive bleeding.  No intervention is needed at this time.  Patient will have video-assisted telemedicine visit in 2 weeks to discuss her laboratory results.  I spent a total of 45 minutes reviewing chart data, face-to-face evaluation with the patient, counseling and coordination of care as detailed above.   Patient expressed understanding and was in agreement with this plan. She also understands that She can call clinic at any time with any questions, concerns, or complaints.     [name], MD   03/19/2021 7:53 AM

## 2021-03-16 DIAGNOSIS — U071 COVID-19: Secondary | ICD-10-CM | POA: Diagnosis not present

## 2021-03-17 ENCOUNTER — Other Ambulatory Visit: Payer: Self-pay

## 2021-03-17 ENCOUNTER — Inpatient Hospital Stay: Payer: BC Managed Care – PPO | Attending: Oncology | Admitting: Oncology

## 2021-03-17 ENCOUNTER — Inpatient Hospital Stay: Payer: BC Managed Care – PPO

## 2021-03-17 ENCOUNTER — Encounter: Payer: Self-pay | Admitting: Oncology

## 2021-03-17 DIAGNOSIS — Z79899 Other long term (current) drug therapy: Secondary | ICD-10-CM | POA: Diagnosis not present

## 2021-03-17 DIAGNOSIS — D6800 Von Willebrand disease, unspecified: Secondary | ICD-10-CM

## 2021-03-17 DIAGNOSIS — N920 Excessive and frequent menstruation with regular cycle: Secondary | ICD-10-CM | POA: Insufficient documentation

## 2021-03-17 DIAGNOSIS — Z801 Family history of malignant neoplasm of trachea, bronchus and lung: Secondary | ICD-10-CM | POA: Insufficient documentation

## 2021-03-17 LAB — CBC WITH DIFFERENTIAL/PLATELET
Abs Immature Granulocytes: 0.06 10*3/uL (ref 0.00–0.07)
Basophils Absolute: 0.1 10*3/uL (ref 0.0–0.1)
Basophils Relative: 1 %
Eosinophils Absolute: 0.1 10*3/uL (ref 0.0–0.5)
Eosinophils Relative: 2 %
HCT: 37.2 % (ref 36.0–46.0)
Hemoglobin: 12.2 g/dL (ref 12.0–15.0)
Immature Granulocytes: 1 %
Lymphocytes Relative: 20 %
Lymphs Abs: 1.2 10*3/uL (ref 0.7–4.0)
MCH: 27.5 pg (ref 26.0–34.0)
MCHC: 32.8 g/dL (ref 30.0–36.0)
MCV: 83.8 fL (ref 80.0–100.0)
Monocytes Absolute: 0.7 10*3/uL (ref 0.1–1.0)
Monocytes Relative: 11 %
Neutro Abs: 4 10*3/uL (ref 1.7–7.7)
Neutrophils Relative %: 65 %
Platelets: 351 10*3/uL (ref 150–400)
RBC: 4.44 MIL/uL (ref 3.87–5.11)
RDW: 13.6 % (ref 11.5–15.5)
WBC: 6.1 10*3/uL (ref 4.0–10.5)
nRBC: 0 % (ref 0.0–0.2)

## 2021-03-17 LAB — PROTIME-INR
INR: 1.1 (ref 0.8–1.2)
Prothrombin Time: 13.7 seconds (ref 11.4–15.2)

## 2021-03-17 LAB — APTT: aPTT: 36 seconds (ref 24–36)

## 2021-03-17 LAB — PLATELET FUNCTION ASSAY
Collagen / ADP: 165 seconds — ABNORMAL HIGH (ref 0–118)
Collagen / Epinephrine: 226 seconds — ABNORMAL HIGH (ref 0–193)

## 2021-03-17 NOTE — Progress Notes (Signed)
Patient states no concerns at the moment. 

## 2021-03-21 LAB — VON WILLEBRAND PANEL
Coagulation Factor VIII: 28 % — ABNORMAL LOW (ref 56–140)
Ristocetin Co-factor, Plasma: <10 % — ABNORMAL LOW (ref 50–200)
Von Willebrand Antigen, Plasma: 15 % — ABNORMAL LOW (ref 50–200)

## 2021-03-21 LAB — COAG STUDIES INTERP REPORT

## 2021-03-28 NOTE — Progress Notes (Signed)
[facility]  Telephone:(336) [phone] Fax:(336) [phone]  ID: Patricia Schwartz OB: 2000/09/30  MR#: [id number]  RXV#:[id number]  Patient Care Team: [facility] as PCP - General  I connected with Patricia Schwartz on 04/02/21 at 11:45 AM EDT by video enabled telemedicine visit and verified that I am speaking with the correct person using two identifiers.   I discussed the limitations, risks, security and privacy concerns of performing an evaluation and management service by telemedicine and the availability of in-person appointments. I also discussed with the patient that there may be a patient responsible charge related to this service. The patient expressed understanding and agreed to proceed.   Other persons participating in the visit and their role in the encounter: Patient, MD.  Patient's location: Home. Provider's location: Clinic.  CHIEF COMPLAINT: von Willebrand's disease.  INTERVAL HISTORY: Patient agreed to video assisted telemedicine visit for further evaluation and discussion of her laboratory results.  Continues to feel well remains asymptomatic.  She has no neurologic complaints.  She denies any recent fevers or illnesses.  She has a good appetite and denies weight loss.  She has no chest pain, shortness of breath, cough, or hemoptysis.  She has no nausea, vomiting, constipation, or diarrhea.  She has no urinary complaints.  Patient offers no specific complaints today.    REVIEW OF SYSTEMS:   Review of Systems  Constitutional: Negative.  Negative for fever, malaise/fatigue and weight loss.  Respiratory: Negative.  Negative for cough, hemoptysis and shortness of breath.   Cardiovascular: Negative.  Negative for chest pain and leg swelling.  Gastrointestinal: Negative.  Negative for abdominal pain.  Genitourinary: Negative.   Musculoskeletal: Negative.  Negative for back pain.  Skin: Negative.  Negative for rash.  Neurological: Negative.   Negative for dizziness, focal weakness, weakness and headaches.  Endo/Heme/Allergies:  Bruises/bleeds easily.  Psychiatric/Behavioral: Negative.  The patient is not nervous/anxious.    As per HPI. Otherwise, a complete review of systems is negative.  PAST MEDICAL HISTORY: Past Medical History:  Diagnosis Date   Anemia 11/2018   Pilonidal abscess 2019    PAST SURGICAL HISTORY: Past Surgical History:  Procedure Laterality Date   Kyleena insertion  06/2019    FAMILY HISTORY: Family History  Problem Relation Age of Onset   Lung cancer Maternal Grandfather     ADVANCED DIRECTIVES (Y/N):  N  HEALTH MAINTENANCE: Social History   Tobacco Use   Smoking status: Never   Smokeless tobacco: Never  Vaping Use   Vaping Use: Never used  Substance Use Topics   Alcohol use: No   Drug use: Never     Colonoscopy:  PAP:  Bone density:  Lipid panel:  No Known Allergies  Current Outpatient Medications  Medication Sig Dispense Refill   desmopressin (DDAVP RHINAL TUBE) 0.01 % SOLN Place 1 drop into the nose daily. As directed. 2.5 mL 5   FLUoxetine (PROZAC) 20 MG capsule Take 20 mg by mouth every morning.     tranexamic acid (LYSTEDA) 650 MG TABS tablet Take 1,300 mg by mouth 3 (three) times daily. 2 tablets, three times daily during menstrual cycle. Take no more than 5 days at a time     Norethindrone-Ethinyl Estradiol-Fe Biphas (LO LOESTRIN FE) 1 MG-10 MCG / 10 MCG tablet Take 1 tablet by mouth daily. 84 tablet 3   No current facility-administered medications for this visit.    OBJECTIVE: There were no vitals filed for this visit.    There  is no height or weight on file to calculate BMI.    ECOG FS:0 - Asymptomatic  General: Well-developed, well-nourished, no acute distress. HEENT: Normocephalic. Neuro: Alert, answering all questions appropriately. Cranial nerves grossly intact. Psych: Normal affect.   LAB RESULTS:  No results found for: NA, K, CL, CO2, GLUCOSE, BUN,  CREATININE, CALCIUM, PROT, ALBUMIN, AST, ALT, ALKPHOS, BILITOT, GFRNONAA, GFRAA  Lab Results  Component Value Date   WBC 6.1 03/17/2021   NEUTROABS 4.0 03/17/2021   HGB 12.2 03/17/2021   HCT 37.2 03/17/2021   MCV 83.8 03/17/2021   PLT 351 03/17/2021     STUDIES: No results found.  ASSESSMENT: von Willebrand's disease.  PLAN:    von Willebrand's disease: Repeat testing of von Willebrand's panel confirms diagnosis.  All of her other laboratory work was either negative or within normal limits.  Patient was given a prescription for intranasal DDAVP and instructed to use 1 to 3 days monthly at the beginning of each menstrual cycle.  No further intervention is needed at this time.  Return to clinic in 3 months with repeat laboratory work and further evaluation.   Heavy menses: Continue follow-up and treatment per OB/GYN.    I provided 30 minutes of face-to-face video visit time during this encounter which included chart review, counseling, and coordination of care as documented above.   Patient expressed understanding and was in agreement with this plan. She also understands that She can call clinic at any time with any questions, concerns, or complaints.     [name], MD   04/02/2021 8:34 AM

## 2021-03-31 ENCOUNTER — Inpatient Hospital Stay (HOSPITAL_BASED_OUTPATIENT_CLINIC_OR_DEPARTMENT_OTHER): Payer: BC Managed Care – PPO | Admitting: Oncology

## 2021-03-31 DIAGNOSIS — D6800 Von Willebrand disease, unspecified: Secondary | ICD-10-CM

## 2021-03-31 MED ORDER — DESMOPRESSIN ACE REFRIGERATED 0.01 % NA SOLN: 1.0000 [drp] | Freq: Every day | NASAL | 5 refills | Status: AC

## 2021-03-31 NOTE — Progress Notes (Signed)
Pt has no new concerns/questions at this time.

## 2021-05-02 ENCOUNTER — Encounter: Payer: Self-pay | Admitting: Oncology

## 2021-05-02 ENCOUNTER — Other Ambulatory Visit: Payer: Self-pay | Admitting: Obstetrics and Gynecology

## 2021-05-02 MED ORDER — LO LOESTRIN FE 1 MG-10 MCG / 10 MCG PO TABS: 1.0000 | ORAL_TABLET | Freq: Every day | ORAL | 3 refills | Status: DC

## 2021-05-03 ENCOUNTER — Other Ambulatory Visit: Payer: Self-pay | Admitting: Oncology

## 2021-05-03 DIAGNOSIS — D6800 Von Willebrand disease, unspecified: Secondary | ICD-10-CM

## 2021-05-21 DIAGNOSIS — N939 Abnormal uterine and vaginal bleeding, unspecified: Secondary | ICD-10-CM | POA: Diagnosis not present

## 2021-05-21 DIAGNOSIS — N926 Irregular menstruation, unspecified: Secondary | ICD-10-CM | POA: Diagnosis not present

## 2021-06-30 ENCOUNTER — Telehealth: Payer: Self-pay | Admitting: Oncology

## 2021-06-30 ENCOUNTER — Inpatient Hospital Stay: Payer: BC Managed Care – PPO

## 2021-06-30 ENCOUNTER — Inpatient Hospital Stay: Payer: BC Managed Care – PPO | Admitting: Oncology

## 2021-06-30 NOTE — Telephone Encounter (Signed)
Done LVM to call back

## 2021-06-30 NOTE — Telephone Encounter (Signed)
Pt called to reschedule her appt for today. She has been in contact with someone with Covid. Call back at [phone]

## 2021-07-06 ENCOUNTER — Other Ambulatory Visit: Payer: Self-pay | Admitting: *Deleted

## 2021-07-06 DIAGNOSIS — D6800 Von Willebrand disease, unspecified: Secondary | ICD-10-CM

## 2021-07-06 DIAGNOSIS — D500 Iron deficiency anemia secondary to blood loss (chronic): Secondary | ICD-10-CM

## 2021-07-07 NOTE — Progress Notes (Signed)
[facility]  Telephone:(336) [phone] Fax:(336) [phone]  ID: Patricia Schwartz OB: 10/08/2000  MR#: [id number]  AOZ#:[id number]  Patient Care Team: [facility] as [facility]CP - General  CHIEF COMPLAINT: von Willebrand's disease.  INTERVAL HISTORY: Patient returns to clinic today for repeat laboratory work and further evaluation.  She reports the desmopressin did not significantly help with her bleeding, but since initiating birth control it has significantly improved.  She currently feels well and is asymptomatic.  She has no neurologic complaints.  She denies any recent fevers or illnesses.  She has a good appetite and denies weight loss.  She has no chest pain, shortness of breath, cough, or hemoptysis.  She has no nausea, vomiting, constipation, or diarrhea.  She has no urinary complaints.  Patient offers no specific complaints today.  REVIEW OF SYSTEMS:   Review of Systems  Constitutional: Negative.  Negative for fever, malaise/fatigue and weight loss.  Respiratory: Negative.  Negative for cough, hemoptysis and shortness of breath.   Cardiovascular: Negative.  Negative for chest pain and leg swelling.  Gastrointestinal: Negative.  Negative for abdominal pain.  Genitourinary: Negative.   Musculoskeletal: Negative.  Negative for back pain.  Skin: Negative.  Negative for rash.  Neurological: Negative.  Negative for dizziness, focal weakness, weakness and headaches.  Endo/Heme/Allergies:  Bruises/bleeds easily.  Psychiatric/Behavioral: Negative.  The patient is not nervous/anxious.    As per HPI. Otherwise, a complete review of systems is negative.  PAST MEDICAL HISTORY: Past Medical History:  Diagnosis Date   Anemia 11/2018   Pilonidal abscess 2019    PAST SURGICAL HISTORY: Past Surgical History:  Procedure Laterality Date   Kyleena insertion  06/2019    FAMILY HISTORY: Family History  Problem Relation Age of Onset   Lung cancer Maternal  Grandfather     ADVANCED DIRECTIVES (Y/N):  N  HEALTH MAINTENANCE: Social History   Tobacco Use   Smoking status: Never   Smokeless tobacco: Never  Vaping Use   Vaping Use: Never used  Substance Use Topics   Alcohol use: No   Drug use: Never     Colonoscopy:  PAP:  Bone density:  Lipid panel:  No Known Allergies  Current Outpatient Medications  Medication Sig Dispense Refill   desmopressin (DDAVP RHINAL TUBE) 0.01 % SOLN Place 1 drop into the nose daily. As directed. 2.5 mL 5   FLUoxetine (PROZAC) 20 MG capsule Take 20 mg by mouth every morning.     Norethindrone-Ethinyl Estradiol-Fe Biphas (LO LOESTRIN FE) 1 MG-10 MCG / 10 MCG tablet Take 1 tablet by mouth daily. Skip the 4 placebo pill at the end of the pill pack and start you next active pill pack 84 tablet 3   tranexamic acid (LYSTEDA) 650 MG TABS tablet Take 1,300 mg by mouth 3 (three) times daily. 2 tablets, three times daily during menstrual cycle. Take no more than 5 days at a time     No current facility-administered medications for this visit.    OBJECTIVE: Vitals:   07/13/21 0952  BP: 116/76  Pulse: 75  Resp: 16  Temp: 98.2 F (36.8 C)  SpO2: 100%      Body mass index is 26.12 kg/m.    ECOG FS:0 - Asymptomatic  General: Well-developed, well-nourished, no acute distress. Eyes: Pink conjunctiva, anicteric sclera. HEENT: Normocephalic, moist mucous membranes. Lungs: No audible wheezing or coughing. Heart: Regular rate and rhythm. Abdomen: Soft, nontender, no obvious distention. Musculoskeletal: No edema, cyanosis, or clubbing. Neuro: Alert,  answering all questions appropriately. Cranial nerves grossly intact. Skin: No rashes or petechiae noted. Psych: Normal affect.   LAB RESULTS:  Lab Results  Component Value Date   NA 134 (L) 07/13/2021   K 3.7 07/13/2021   CL 104 07/13/2021   CO2 23 07/13/2021   GLUCOSE 92 07/13/2021   BUN 11 07/13/2021   CREATININE 0.95 07/13/2021   CALCIUM 8.8 (L)  07/13/2021   PROT 7.3 07/13/2021   ALBUMIN 4.2 07/13/2021   AST 18 07/13/2021   ALT 11 07/13/2021   ALKPHOS 45 07/13/2021   BILITOT 0.4 07/13/2021   GFRNONAA >60 07/13/2021    Lab Results  Component Value Date   WBC 4.2 07/13/2021   NEUTROABS 2.5 07/13/2021   HGB 11.2 (L) 07/13/2021   HCT 35.4 (L) 07/13/2021   MCV 84.5 07/13/2021   PLT 321 07/13/2021   Lab Results  Component Value Date   IRON 68 07/13/2021   TIBC 393 07/13/2021   IRONPCTSAT 17 07/13/2021   Lab Results  Component Value Date   FERRITIN 4 (L) 07/13/2021     STUDIES: No results found.  ASSESSMENT: von Willebrand's disease.  PLAN:    von Willebrand's disease: Repeat testing of von Willebrand's panel confirms diagnosis.  All of her other laboratory work was either negative or within normal limits.  Given her menses are better controlled with birth control, patient has been instructed to discontinue DDAVP.  No treatment is needed at this time.  Patient will call clinic towards the end of her spring semester to schedule appointment on summer break.   Heavy menses: Patient is now on birth control with improvement of symptoms.  Continue follow-up and treatment per OB/GYN.  Iron deficiency anemia: Mild, monitor.   Patient expressed understanding and was in agreement with this plan. She also understands that She can call clinic at any time with any questions, concerns, or complaints.     Patricia Ruths, MD   07/14/2021 7:17 AM

## 2021-07-13 ENCOUNTER — Other Ambulatory Visit: Payer: Self-pay

## 2021-07-13 ENCOUNTER — Inpatient Hospital Stay (HOSPITAL_BASED_OUTPATIENT_CLINIC_OR_DEPARTMENT_OTHER): Payer: BC Managed Care – PPO | Admitting: Oncology

## 2021-07-13 ENCOUNTER — Encounter: Payer: Self-pay | Admitting: Oncology

## 2021-07-13 ENCOUNTER — Inpatient Hospital Stay: Payer: BC Managed Care – PPO | Attending: Oncology

## 2021-07-13 VITALS — BP 116/76 | HR 75 | Temp 98.2°F | Resp 16 | Ht 67.0 in | Wt 166.8 lb

## 2021-07-13 DIAGNOSIS — D6800 Von Willebrand disease, unspecified: Secondary | ICD-10-CM

## 2021-07-13 DIAGNOSIS — N920 Excessive and frequent menstruation with regular cycle: Secondary | ICD-10-CM | POA: Diagnosis not present

## 2021-07-13 DIAGNOSIS — D500 Iron deficiency anemia secondary to blood loss (chronic): Secondary | ICD-10-CM

## 2021-07-13 DIAGNOSIS — D509 Iron deficiency anemia, unspecified: Secondary | ICD-10-CM | POA: Insufficient documentation

## 2021-07-13 LAB — COMPREHENSIVE METABOLIC PANEL
ALT: 11 U/L (ref 0–44)
AST: 18 U/L (ref 15–41)
Albumin: 4.2 g/dL (ref 3.5–5.0)
Alkaline Phosphatase: 45 U/L (ref 38–126)
Anion gap: 7 (ref 5–15)
BUN: 11 mg/dL (ref 6–20)
CO2: 23 mmol/L (ref 22–32)
Calcium: 8.8 mg/dL — ABNORMAL LOW (ref 8.9–10.3)
Chloride: 104 mmol/L (ref 98–111)
Creatinine, Ser: 0.95 mg/dL (ref 0.44–1.00)
GFR, Estimated: >60 mL/min (ref >60)
Glucose, Bld: 92 mg/dL (ref 70–99)
Potassium: 3.7 mmol/L (ref 3.5–5.1)
Sodium: 134 mmol/L — ABNORMAL LOW (ref 135–145)
Total Bilirubin: 0.4 mg/dL (ref 0.3–1.2)
Total Protein: 7.3 g/dL (ref 6.5–8.1)

## 2021-07-13 LAB — CBC WITH DIFFERENTIAL/PLATELET
Abs Immature Granulocytes: 0.01 10*3/uL (ref 0.00–0.07)
Basophils Absolute: 0.1 10*3/uL (ref 0.0–0.1)
Basophils Relative: 1 %
Eosinophils Absolute: 0.2 10*3/uL (ref 0.0–0.5)
Eosinophils Relative: 4 %
HCT: 35.4 % — ABNORMAL LOW (ref 36.0–46.0)
Hemoglobin: 11.2 g/dL — ABNORMAL LOW (ref 12.0–15.0)
Immature Granulocytes: 0 %
Lymphocytes Relative: 24 %
Lymphs Abs: 1 10*3/uL (ref 0.7–4.0)
MCH: 26.7 pg (ref 26.0–34.0)
MCHC: 31.6 g/dL (ref 30.0–36.0)
MCV: 84.5 fL (ref 80.0–100.0)
Monocytes Absolute: 0.4 10*3/uL (ref 0.1–1.0)
Monocytes Relative: 10 %
Neutro Abs: 2.5 10*3/uL (ref 1.7–7.7)
Neutrophils Relative %: 61 %
Platelets: 321 10*3/uL (ref 150–400)
RBC: 4.19 MIL/uL (ref 3.87–5.11)
RDW: 14 % (ref 11.5–15.5)
WBC: 4.2 10*3/uL (ref 4.0–10.5)
nRBC: 0 % (ref 0.0–0.2)

## 2021-07-13 LAB — IRON AND TIBC
Iron: 68 ug/dL (ref 28–170)
Saturation Ratios: 17 % (ref 10.4–31.8)
TIBC: 393 ug/dL (ref 250–450)
UIBC: 325 ug/dL

## 2021-07-13 LAB — FERRITIN: Ferritin: 4 ng/mL — ABNORMAL LOW (ref 11–307)

## 2021-07-15 LAB — COAG STUDIES INTERP REPORT

## 2021-07-15 LAB — VON WILLEBRAND PANEL
Coagulation Factor VIII: 56 % (ref 56–140)
Ristocetin Co-factor, Plasma: 37 % — ABNORMAL LOW (ref 50–200)
Von Willebrand Antigen, Plasma: 45 % — ABNORMAL LOW (ref 50–200)

## 2021-07-21 LAB — VON WILLEBRAND FACTOR MULTIMER

## 2021-08-15 ENCOUNTER — Encounter: Payer: Self-pay | Admitting: Oncology

## 2021-08-16 ENCOUNTER — Telehealth: Payer: Self-pay

## 2021-08-16 NOTE — Telephone Encounter (Signed)
Spoke w/patient. Advised 3 packs w/3 refills were sent on 05/02/2021 to [facility]. Notified patient [facility]. She will contact [facility] to request refill. Also advised if it is "too soon for refill" she could request family to overnight the pack she left at home.

## 2021-08-16 NOTE — Telephone Encounter (Signed)
Patient is in [place] attends [employer]. She left her birth control at home in [place]. She isn't going to be home anytime soon. She is on her period and has one more day before she needs the next pack. Unable to request refill thru my chart. Uncertain what to do. Cb#[phone]

## 2021-12-26 ENCOUNTER — Telehealth: Payer: Self-pay

## 2021-12-26 ENCOUNTER — Other Ambulatory Visit: Payer: Self-pay | Admitting: Licensed Practical Nurse

## 2021-12-26 DIAGNOSIS — N920 Excessive and frequent menstruation with regular cycle: Secondary | ICD-10-CM

## 2021-12-26 DIAGNOSIS — N939 Abnormal uterine and vaginal bleeding, unspecified: Secondary | ICD-10-CM

## 2021-12-26 MED ORDER — NORETHINDRONE ACETATE 5 MG PO TABS: ORAL_TABLET | ORAL | 0 refills | Status: DC

## 2021-12-26 MED ORDER — LEVONORGEST-ETH ESTRAD 91-DAY 0.15-0.03 &0.01 MG PO TABS: 1.0000 | ORAL_TABLET | Freq: Every day | ORAL | 4 refills | Status: DC

## 2021-12-26 NOTE — Telephone Encounter (Signed)
Pt left message stating she is bleeding been on lo Loestrin for awhile now no problems. Started a period been bleeding for 8 day and had cramps 2 of the days, she states its not time for her period. She has not missed any pills and has took them on time.I called pt left message for her to call me back

## 2021-12-26 NOTE — Telephone Encounter (Signed)
Pt reports Period came 2 week early. Pt has Vonwillebrands diease shes worried that having this and being anemic and bleeding this long is not good.She's been on Lo Loestrin and never had any problems,never missed any pills and always took pills on time, been bleeding for 8 days, I advised if changing pad every hour to go to ER , She states sometimes it heavy and sometimes its not. Can something be sent in to stop this bleeding?

## 2021-12-26 NOTE — Progress Notes (Signed)
Patricia Schwartz has a hx AUB/heavy menstrual cycles and Von Willebrand's .  She was started on Lo lo estrin and TXA for management. Lo Lo estrin had been working, but she started bleeding on June 11 and has not stopped, she needs to change a super tampon every 2 hours.  She did have cramping, but that has stopped.  She took TXA and her nasal spray on the 15 and 16, but stopped them as she was nervous because the bleeding she is experiencing is not her normal cycle, her LMP was May 27, this bleeding started 2 weeks into her cycle.  She has been taking her OCP as prescribed. Denies any SOB or dizziness, but states she feels likes she has in the  past when her iron is low, she is exhausted and states that her eyes look pale "there is no redness".  She is sexually active, does not believe she could be pregnant.  She is currently at school in [place].   Dr Logan Bores called. Reviewed management to stop bleeding now and to manage future cycles with a different medication (pt tried IUD but had continuous bleeding for 6 months),  Will start Aygestin 5mg  TID x 7 days, then BID x 7 days, ok to continue LoLo estrin, Recommend a however will switch to [facility] after Aygestin course complete.  Will start Iron supplement and increase fluids Bleeding precautions reviewed  Pt in agreement with above plan. Will scheduled fu once she is home [facility][place], CNM  [facility]  12/26/21  5:46 PM

## 2022-03-14 NOTE — Telephone Encounter (Addendum)
TRIAGE VOICEMAIL: Patient reports she called a few months ago about having some problems with some bleeding during her cycle. She had a midwife called her back who prescribed her a new birth control Simpesse. She needs a refill. Cb#[id number]

## 2022-03-14 NOTE — Telephone Encounter (Signed)
Spoke with patient. Advised a full year rx of Simpesse/ Levonorgestrel-Ethinyl Estradiol (AMETHIA) 0.15-0.03 &0.01 MG tablet  Was sent to [facility] 01/09/22. Patient advised to contact pharmacy for refill.

## 2022-06-29 ENCOUNTER — Ambulatory Visit: Payer: BC Managed Care – PPO | Admitting: Obstetrics and Gynecology

## 2023-02-23 ENCOUNTER — Ambulatory Visit: Payer: Self-pay | Admitting: Obstetrics

## 2023-02-23 ENCOUNTER — Ambulatory Visit: Payer: Self-pay | Admitting: Licensed Practical Nurse

## 2023-03-06 ENCOUNTER — Other Ambulatory Visit (HOSPITAL_COMMUNITY)
Admission: RE | Admit: 2023-03-06 | Discharge: 2023-03-06 | Disposition: A | Discharge status: Home / Self Care | Payer: BC Managed Care – PPO | Source: Ambulatory Visit | Attending: Licensed Practical Nurse | Admitting: Licensed Practical Nurse

## 2023-03-06 ENCOUNTER — Ambulatory Visit (INDEPENDENT_AMBULATORY_CARE_PROVIDER_SITE_OTHER): Payer: BC Managed Care – PPO | Admitting: Obstetrics

## 2023-03-06 ENCOUNTER — Encounter: Payer: Self-pay | Admitting: Obstetrics

## 2023-03-06 VITALS — BP 116/78 | HR 76 | Ht 67.0 in | Wt 180.0 lb

## 2023-03-06 DIAGNOSIS — Z113 Encounter for screening for infections with a predominantly sexual mode of transmission: Secondary | ICD-10-CM | POA: Diagnosis not present

## 2023-03-06 DIAGNOSIS — Z124 Encounter for screening for malignant neoplasm of cervix: Secondary | ICD-10-CM | POA: Diagnosis present

## 2023-03-06 DIAGNOSIS — Z1151 Encounter for screening for human papillomavirus (HPV): Secondary | ICD-10-CM | POA: Diagnosis not present

## 2023-03-06 DIAGNOSIS — R87610 Atypical squamous cells of undetermined significance on cytologic smear of cervix (ASC-US): Secondary | ICD-10-CM | POA: Insufficient documentation

## 2023-03-06 DIAGNOSIS — Z01419 Encounter for gynecological examination (general) (routine) without abnormal findings: Secondary | ICD-10-CM | POA: Diagnosis present

## 2023-03-06 DIAGNOSIS — N939 Abnormal uterine and vaginal bleeding, unspecified: Secondary | ICD-10-CM

## 2023-03-06 DIAGNOSIS — R87810 Cervical high risk human papillomavirus (HPV) DNA test positive: Secondary | ICD-10-CM | POA: Insufficient documentation

## 2023-03-06 MED ORDER — LORAZEPAM 1 MG PO TABS: 1.0000 mg | ORAL_TABLET | Freq: Once | ORAL | 0 refills | Status: AC

## 2023-03-06 NOTE — Progress Notes (Signed)
ANNUAL GYNECOLOGICAL EXAM  SUBJECTIVE  HPI  Patricia Schwartz is a 22 y.o.-year-old G0P0000 who presents for an annual gynecological exam today.  She reports significant pain and bleeding with menstrual periods. She has a h/o von Willebrand disease and has had heavy periods since menses. Various forms of contraception have improved symptoms to some degree, but only for a limited time. She had a [place] IUD, which improved her pain, but caused daily bleeding for 6 months. Her pain and cramping impact her ability to participate in daily activities. Her periods have spaced out to sometimes 40-45 days between the start of cycles. Each cycle lasts 7-11 days and she has to change a super tampon q2h and bleeds through her sheets at night. She experiences premenstrual symptoms for weeks before her period. For the past month and a half, she has noticed an increase in acne and weight gain. Her mother has a h/o severe endometriosis. She is interested in possibly discussing surgical management if appropriate. She is sexually active with one female partner and would like STI testing today.  Medical/Surgical History Past Medical History:  Diagnosis Date   Anemia 11/2018   Pilonidal abscess 2019   Past Surgical History:  Procedure Laterality Date   Kyleena insertion  06/2019    Social History Lives with mother. Feels safe there Work: Database administrator at [employer] Exercise: Substances: Weekly EtOH, +vape, denies tobacco and recreational drugs  Obstetric History OB History     Gravida  0   Para  0   Term  0   Preterm  0   AB  0   Living  0      SAB  0   IAB  0   Ectopic  0   Multiple  0   Live Births  0            GYN/Menstrual History Patient's last menstrual period was 03/01/2023 (approximate). Irregular periods Last Pap: unsure Contraception: none  Prevention Mammogram at 39 Colonoscopy at 44  Current Medications Outpatient Medications Prior to Visit   Medication Sig   desmopressin (DDAVP RHINAL TUBE) 0.01 % SOLN Place 1 drop into the nose daily. As directed.   FLUoxetine (PROZAC) 20 MG capsule Take 20 mg by mouth every morning.   tranexamic acid (LYSTEDA) 650 MG TABS tablet Take 1,300 mg by mouth 3 (three) times daily. 2 tablets, three times daily during menstrual cycle. Take no more than 5 days at a time   [DISCONTINUED] Levonorgestrel-Ethinyl Estradiol (AMETHIA) 0.15-0.03 &0.01 MG tablet Take 1 tablet by mouth daily.   [DISCONTINUED] norethindrone (AYGESTIN) 5 MG tablet Take 1 tablet (5 mg total) by mouth in the morning, at noon, and at bedtime for 7 days, THEN 1 tablet (5 mg total) 2 (two) times daily for 7 days.   No facility-administered medications prior to visit.      Upstream - 03/06/23 0844       Pregnancy Intention Screening   Does the patient want to become pregnant in the next year? No    Does the patient's partner want to become pregnant in the next year? No    Would the patient like to discuss contraceptive options today? Yes      Contraception Wrap Up   Current Method No Contraceptive Precautions    End Method Unknown/Not Reported    Contraception Counseling Provided Yes            The pregnancy intention screening data noted above was reviewed.  Potential methods of contraception were discussed. The patient elected to proceed with IUD  ROS Constitutional: Denied constitutional symptoms, recent illness, fatigue, fever, insomnia and weight loss. +night sweats  Eyes: Denied eye symptoms, eye pain, photophobia, vision change and visual disturbance.  Ears/Nose/Throat/Neck: Denied ear, nose, throat or neck symptoms, hearing loss, nasal discharge, sinus congestion and sore throat.  Cardiovascular: Denied cardiovascular symptoms, arrhythmia, chest pain/pressure, edema, exercise intolerance, orthopnea and palpitations.  Respiratory: Denied pulmonary symptoms, asthma, pleuritic pain, productive sputum, cough, dyspnea and  wheezing.  Gastrointestinal: +nausea and constipation  Genitourinary: See HPI  Musculoskeletal: Denied musculoskeletal symptoms, stiffness, swelling, muscle weakness and myalgia.  Dermatologic: Denied dermatology symptoms, rash and scar.  Neurologic: Denied neurology symptoms, dizziness, headache, neck pain and syncope.  Psychiatric: +anxiety, managed with Prozac  Endocrine: Denied endocrine symptoms including hot flashes and night sweats.    OBJECTIVE  BP 116/78   Pulse 76   Ht 5\' 7"  (1.702 m)   Wt 180 lb (81.6 kg)   LMP 03/01/2023 (Approximate)   BMI 28.19 kg/m    Physical examination General NAD, Conversant  HEENT Atraumatic; Op clear with mmm.  Normo-cephalic. Pupils reactive. Anicteric sclerae  Thyroid/Neck Smooth without nodularity or enlargement. Normal ROM.  Neck Supple.  Skin No rashes, lesions or ulceration. Normal palpated skin turgor. No nodularity.  Breasts: Declined  Lungs: Clear to auscultation.No rales or wheezes. Normal Respiratory effort, no retractions.  Heart: NSR.  No murmurs or rubs appreciated. No peripheral edema  Abdomen: Soft.  Non-tender.  No masses.  No HSM. No hernia  Extremities: Moves all appropriately.  Normal ROM for age. No lymphadenopathy.  Neuro: Oriented to PPT.  Normal mood. Normal affect.     Pelvic:   Vulva: Normal appearance.  No lesions.  Vagina: No lesions or abnormalities noted.  Support: Normal pelvic support.  Urethra No masses tenderness or scarring.  Meatus Normal size without lesions or prolapse.  Cervix: Normal appearance.  No lesions. Pap collected.  Perineum: Normal exam.  No lesions.        Bimanual   Uterus: Normal size.  Non-tender.  Mobile.  AV.  Adnexae: No masses.  Non-tender to palpation.  Cul-de-sac: Negative for abnormality.    ASSESSMENT  1) Annual exam 2) Heavy menstrual bleeding and menstrual irregularities  PLAN 1) Physical exam as noted. Discussed healthy lifestyle choices and preventive care. STI  swabs and blood work collected. Encouraged condoms for pregnancy prevention. Pap collected. Labs: A1C, CBC, lipid profile, TSH, vitamin D. 2) Discussed possible causes of irregular bleeding and heavy menses. Known VWB and family h/o endometriosis. Some symptoms in line with PCOS. Discussed testing and treatment options.  -PCOS labs today -Pelvic US -Consult with MD for discussion of surgical options  -Desires IUD placement. Discussed adding a 48-month course of OCPs with IUD to improve bleeding. Will schedule when ready.    Return in one year for annual exam or as needed for concerns.   Guadlupe Spanish, CNM

## 2023-03-08 LAB — TSH+FREE T4
Free T4: 0.97 ng/dL (ref 0.82–1.77)
TSH: 1.43 u[IU]/mL (ref 0.450–4.500)

## 2023-03-08 LAB — HEPATITIS B SURFACE ANTIBODY,QUALITATIVE: Hep B Surface Ab, Qual: NONREACTIVE

## 2023-03-08 LAB — RPR: RPR Ser Ql: NONREACTIVE

## 2023-03-08 LAB — LIPID PANEL
Chol/HDL Ratio: 2.6 ratio (ref 0.0–4.4)
Cholesterol, Total: 166 mg/dL (ref 100–199)
HDL: 64 mg/dL (ref >39)
LDL Chol Calc (NIH): 89 mg/dL (ref 0–99)
Triglycerides: 65 mg/dL (ref 0–149)
VLDL Cholesterol Cal: 13 mg/dL (ref 5–40)

## 2023-03-08 LAB — CBC
Hematocrit: 38.1 % (ref 34.0–46.6)
Hemoglobin: 12.4 g/dL (ref 11.1–15.9)
MCH: 28.9 pg (ref 26.6–33.0)
MCHC: 32.5 g/dL (ref 31.5–35.7)
MCV: 89 fL (ref 79–97)
Platelets: 321 10*3/uL (ref 150–450)
RBC: 4.29 x10E6/uL (ref 3.77–5.28)
RDW: 12.2 % (ref 11.7–15.4)
WBC: 5.3 10*3/uL (ref 3.4–10.8)

## 2023-03-08 LAB — TESTOSTERONE, FREE, TOTAL, SHBG
Sex Hormone Binding: 43.8 nmol/L (ref 24.6–122.0)
Testosterone, Free: 1.8 pg/mL (ref 0.0–4.2)
Testosterone: 31 ng/dL (ref 13–71)

## 2023-03-08 LAB — HIV ANTIBODY (ROUTINE TESTING W REFLEX): HIV Screen 4th Generation wRfx: NONREACTIVE

## 2023-03-08 LAB — FSH/LH
FSH: 6.2 m[IU]/mL
LH: 9.3 m[IU]/mL

## 2023-03-08 LAB — VITAMIN D 25 HYDROXY (VIT D DEFICIENCY, FRACTURES): Vit D, 25-Hydroxy: 40.6 ng/mL (ref 30.0–100.0)

## 2023-03-08 LAB — HEMOGLOBIN A1C
Est. average glucose Bld gHb Est-mCnc: 100 mg/dL
Hgb A1c MFr Bld: 5.1 % (ref 4.8–5.6)

## 2023-03-08 LAB — 17-HYDROXYPROGESTERONE: 17-OH Progesterone LCMS: 28 ng/dL

## 2023-03-08 LAB — HEPATITIS B SURFACE ANTIGEN: Hepatitis B Surface Ag: NEGATIVE

## 2023-03-08 LAB — HEPATITIS C ANTIBODY: Hep C Virus Ab: NONREACTIVE

## 2023-03-08 LAB — PROLACTIN: Prolactin: 17.3 ng/mL (ref 4.8–33.4)

## 2023-03-14 ENCOUNTER — Telehealth: Payer: Self-pay

## 2023-03-14 ENCOUNTER — Encounter: Payer: Self-pay | Admitting: Obstetrics

## 2023-03-14 NOTE — Telephone Encounter (Signed)
Pt calling about test results; does she need to schedule and appointment to discuss next steps?  [phone]

## 2023-03-15 LAB — CYTOLOGY - PAP
Chlamydia: NEGATIVE
Comment: NEGATIVE
Comment: NEGATIVE
Comment: NEGATIVE
Comment: NORMAL
Diagnosis: UNDETERMINED — AB
High risk HPV: POSITIVE — AB
Neisseria Gonorrhea: NEGATIVE
Trichomonas: NEGATIVE

## 2023-03-23 ENCOUNTER — Ambulatory Visit: Payer: BC Managed Care – PPO | Admitting: Obstetrics and Gynecology

## 2023-03-23 ENCOUNTER — Ambulatory Visit: Payer: BC Managed Care – PPO | Admitting: Obstetrics

## 2023-03-23 ENCOUNTER — Encounter: Payer: Self-pay | Admitting: Obstetrics and Gynecology

## 2023-03-23 VITALS — BP 119/71 | HR 77 | Resp 16 | Ht 67.0 in | Wt 181.5 lb

## 2023-03-23 DIAGNOSIS — N938 Other specified abnormal uterine and vaginal bleeding: Secondary | ICD-10-CM

## 2023-03-23 DIAGNOSIS — Z842 Family history of other diseases of the genitourinary system: Secondary | ICD-10-CM | POA: Diagnosis not present

## 2023-03-23 DIAGNOSIS — D6800 Von Willebrand disease, unspecified: Secondary | ICD-10-CM | POA: Diagnosis not present

## 2023-03-23 DIAGNOSIS — N939 Abnormal uterine and vaginal bleeding, unspecified: Secondary | ICD-10-CM | POA: Diagnosis not present

## 2023-03-23 DIAGNOSIS — Z3202 Encounter for pregnancy test, result negative: Secondary | ICD-10-CM | POA: Diagnosis not present

## 2023-03-23 DIAGNOSIS — N946 Dysmenorrhea, unspecified: Secondary | ICD-10-CM | POA: Diagnosis not present

## 2023-03-23 DIAGNOSIS — Z30430 Encounter for insertion of intrauterine contraceptive device: Secondary | ICD-10-CM

## 2023-03-23 LAB — POCT URINE PREGNANCY: Preg Test, Ur: NEGATIVE

## 2023-03-23 MED ORDER — LEVONORGESTREL 20 MCG/DAY IU IUD
1.0000 | INTRAUTERINE_SYSTEM | Freq: Once | INTRAUTERINE | Status: AC
Start: 2023-03-23 — End: 2023-03-23
  Administered 2023-03-23: 1 via INTRAUTERINE

## 2023-03-23 NOTE — Patient Instructions (Signed)

## 2023-03-23 NOTE — Progress Notes (Signed)
GYNECOLOGY PROGRESS NOTE  Subjective:    Patient ID: Patricia Schwartz, female    DOB: June 30, 2001, 22 y.o.   MRN: [id number]  HPI  Patient is a 22 y.o. G0P0000 female who presents for consultation for surgical options for endometriosis.  She was referred by Guadlupe Spanish, CNM.  She has a history of von Willebrand's disease.  Patient reports that she has dealt with lengthy cycles since onset of menarche, cycles typically last between 7 to 11 days but on average are about 10 days.  Complains of feeling fatigue and feeling "gross"for approximately 2 weeks before her cycle starts.  Cycles, approximately every 45 days and are associated with moderate to severe cramping which can sometimes affect her daily activities such as going to work or going to school.  Is currently using heating pads and 800 mg of ibuprofen when her cycle starts.  Also notes that sometimes it hurts to sit when she has her cycles and experiences some rectal pain.  She does have a family history of endometriosis in her mother and wonders if she also has a diagnosis.  She did have a history of use of Kyleena IUD to help manage her periods however notes that with insertion of the IUD she bled for approximately 6 months straight and so had it removed.  Is currently utilizing Lysteda for management of her heavy cycles..  Is unable to use estrogen containing options for birth control due to her history of VWD.    The following portions of the patient's history were reviewed and updated as appropriate:   She  has a past medical history of Anemia (11/2018) and Pilonidal abscess (2019).  She  has a past surgical history that includes Kyleena insertion (06/2019).  Her family history includes Lung cancer in her maternal grandfather.  She  reports that she has never smoked. She has never used smokeless tobacco. She reports that she does not drink alcohol and does not use drugs.  Current Outpatient Medications on File Prior to Visit   Medication Sig Dispense Refill   desmopressin (DDAVP RHINAL TUBE) 0.01 % SOLN Place 1 drop into the nose daily. As directed. 2.5 mL 5   FLUoxetine (PROZAC) 20 MG capsule Take 20 mg by mouth every morning.     tranexamic acid (LYSTEDA) 650 MG TABS tablet Take 1,300 mg by mouth 3 (three) times daily. 2 tablets, three times daily during menstrual cycle. Take no more than 5 days at a time     No current facility-administered medications on file prior to visit.   She has No Known Allergies..  Review of Systems Pertinent items noted in HPI and remainder of comprehensive ROS otherwise negative.   Objective:   Blood pressure 119/71, pulse 77, resp. rate 16, height 5\' 7"  (1.702 m), weight 181 lb 8 oz (82.3 kg), last menstrual period 03/01/2023.  Body mass index is 28.43 kg/m. General appearance: alert and no distress Abdomen: soft, non-tender; bowel sounds normal; no masses,  no organomegaly Pelvic: external genitalia normal, rectovaginal septum normal.  Vagina without discharge.  Cervix normal appearing, no lesions and no motion tenderness.  Uterus mobile, nontender, normal shape and size.  Adnexae non-palpable, nontender bilaterally.  Extremities: extremities normal, atraumatic, no cyanosis or edema Neurologic: Grossly normal   Assessment:   1. Dysfunctional uterine bleeding   2. Von Willebrand's disease (HCC)   3. Dysmenorrhea   4. Family history of endometriosis   5. Encounter for IUD insertion  Plan:   - Discussion had with patient regarding her symptoms.  Discussed that it was very possible that she could have endometriosis based on current symptoms and family history.  I also discussed that the gold standard of diagnosis of endometriosis is laparoscopy however we typically utilize surgery as a last resort after attempting to utilize medications for suppression of menstrual cycles.  I discussed all available options for patient including progesterone pills, Nexplanon, Depo  Provera, higher dose IUD such as Mirena, and GnRH antagonists.  She reports that she has been fearful of other medications due to concerns for weight gain.  I discussed that the majority of these options do not cause significant weight gain except for potentially the Nexplanon or Depo-Provera.  After discussion of all options, patient is willing to try the Mirena IUD.  Notes that this worked well for her mother prior to deciding to have a hysterectomy.  IUD inserted today, see procedure note below.  Follow-up in 4 weeks for IUD check.  Reports that an ultrasound was ordered at her last visit to assess for any structural causes of bleeding.  Patient wonders if she needs to keep this appointment.  Advised to keep appointment but can reschedule to have ultrasound on same day as her IUD checkup.  - Can continue to utilize ibuprofen and can alternate with Tylenol as needed for dysmenorrhea for now, however hopeful that the IUD will decrease the symptoms as well.  -Von Willebrand's disease currently using DDAVP.  Patient by hematology/oncology.    IUD Insertion Procedure Note Patient identified, informed consent performed, consent signed.   Discussed risks of irregular bleeding, cramping, infection, malpositioning or misplacement of the IUD outside the uterus which may require further procedure such as laparoscopy. Also discussed >99% contraception efficacy, increased risk of ectopic pregnancy with failure of method.   Emphasized that this did not protect against STIs, condoms recommended during all sexual encounters. Time out was performed.  Urine pregnancy test negative.  Speculum placed in the vagina.  Cervix visualized.  Cleaned with Betadine x 2.  Grasped anteriorly with a single tooth tenaculum.  Uterus sounded to 7 cm.  Mirena IUD placed per manufacturer's recommendations.  Strings trimmed to 3 cm. Tenaculum was removed, good hemostasis noted.  Patient tolerated procedure well.   Patient was given  post-procedure instructions.  She was advised to have backup contraception for one week.  Patient was also asked to check IUD strings periodically and follow up in 4 weeks for IUD check.   Exp: 02/2025 Lot: [id number]    A total of 38 minutes were spent during this encounter, including review of previous progress notes, recent imaging and labs, face-to-face with time with patient involving counseling and coordination of care, as well as documentation for current visit.  Hildred Laser, MD [facility]

## 2023-03-30 ENCOUNTER — Other Ambulatory Visit: Payer: BC Managed Care – PPO

## 2023-04-11 ENCOUNTER — Telehealth: Payer: Self-pay

## 2023-04-11 DIAGNOSIS — N938 Other specified abnormal uterine and vaginal bleeding: Secondary | ICD-10-CM

## 2023-04-11 DIAGNOSIS — D6800 Von Willebrand disease, unspecified: Secondary | ICD-10-CM

## 2023-04-11 DIAGNOSIS — N921 Excessive and frequent menstruation with irregular cycle: Secondary | ICD-10-CM

## 2023-04-11 MED ORDER — NORETHIN ACE-ETH ESTRAD-FE 1-20 MG-MCG(24) PO TABS
1.0000 | ORAL_TABLET | Freq: Every day | ORAL | 0 refills | Status: DC
Start: 2023-04-11 — End: 2023-07-30

## 2023-04-11 NOTE — Telephone Encounter (Signed)
That's fine. Can send in Loestrin 20 mg daily, can take continuously for 3 months (#112 pills).

## 2023-04-11 NOTE — Telephone Encounter (Signed)
Patient calling in due to heavy bleeding and cramping since her IUD was inserted 3 weeks ago. Patient has a history of VWD and states that hr and Missy discussed takung OCP's along with the IUD for 3 months. She is interested in this option. Please advise

## 2023-04-11 NOTE — Addendum Note (Signed)
Addended by: Loman Chroman on: 04/11/2023 02:34 PM   Modules accepted: Orders

## 2023-04-11 NOTE — Telephone Encounter (Signed)
Rx sent in to pharmacy on file. Patient is aware of rx the instructions.

## 2023-04-18 ENCOUNTER — Ambulatory Visit: Payer: BC Managed Care – PPO | Admitting: Obstetrics and Gynecology

## 2023-04-18 NOTE — Patient Instructions (Signed)
Hormonal Contraception Information Hormonal contraception is a type of birth control that uses hormones to prevent pregnancy. It usually involves a combination of the hormones estrogen and progesterone, or only the hormone progesterone. Hormonal contraception works in these ways: It thickens the mucus in the cervix, which is the lowest part of the uterus. Thicker mucus makes it harder for sperm to enter the uterus. It changes the lining of the uterus. This makes it harder for an egg to attach or implant. It may stop the ovaries from releasing eggs (ovulation). Some women who take hormonal contraceptives that contain only progesterone may continue to ovulate. Hormonal contraception cannot prevent STIs (sexually transmitted infections). Pregnancy may still occur. Types of hormonal contraception  Estrogen and progesterone contraceptives Contraceptives that use a combination of estrogen and progesterone are available in these forms: Pill. Pills come in different combinations of hormones. Pills must be taken at the same time each day. They can affect your period. You can get your period monthly, once every 3 months, or not at all. Patch. The patch is applied to the buttocks, abdomen, upper outer arm, or back. It is kept in place for 3 weeks. It is removed for the last or fourth week of the cycle. Vaginal ring. The ring is placed in the vagina and left there for 3 weeks. It is then removed for the last or fourth week of the cycle. Progesterone-only contraceptives Contraceptives that use only progesterone are available in these forms: Pill. Pills should be taken at the same time everyday. This is very important to decrease the chance of pregnancy. Pills containing progestin-only are usually taken every day of the cycle. Other types of pills may have a placebo tablet for the last 4 days of every cycle. Intrauterine device (IUD). This device is inserted through the vagina and cervix into the uterus. It is  removed or replaced every 3 to 5 years, depending on the type. It can be removed sooner. Implant. Plastic rods are placed under the skin of the upper arm. They are removed or replaced every 3 years. They can be removed sooner. Shot (injection). The injection is given once every 12 or 13 weeks (about 3 months). Risks associated with hormonal contraception Estrogen and progesterone contraceptives can sometimes cause side effects, such as: Nausea. Headaches. Breast tenderness. Bleeding or spotting between menstrual cycles. High blood pressure (rare). Strokes, heart attacks, or blood clots (rare). Progesterone-only contraceptives also can have side effects, such as: Nausea. Headaches. Breast tenderness. Irregular menstrual bleeding. High blood pressure (rare). Talk to your health care provider about what side effects may mean for you. Questions to ask: What type of hormonal contraception is right for me? How long should I plan to use hormonal contraception? What are the side effects of the hormonal contraception method I choose? How can I prevent STIs while using hormonal contraception? Where to find more information Ask your health care provider for more information and resources about hormonal contraception. You can also go to: U.S. Department of Health and [employer], Office on Women's Health: http://hoffman.com/ Summary Estrogen and progesterone are hormones used in many forms of birth control. Hormonal contraception cannot prevent STIs (sexually transmitted infections). Talk to your health care provider about what side effects may mean for you. Ask your health care provider for more information and resources about hormonal contraception. This information is not intended to replace advice given to you by your health care provider. Make sure you discuss any questions you have with your health care provider.  Document Revised: 03/02/2020 Document Reviewed: 03/03/2020 Elsevier  Patient Education  2024 [employer].

## 2023-04-18 NOTE — Progress Notes (Unsigned)
    GYNECOLOGY OFFICE ENCOUNTER NOTE  History:  22 y.o. G0P0000 here today for today for IUD string check; Mirena  IUD was placed  03/23/2023. She has been bleeding x 1 month, bleeding was noted to be moderate to heavy. She started taking Loestrin to help with bleeding. Bleeding has gradually slowed to spotting now. Cramping was really bad for about 2 weeks, it has now resolved. Of note, patient with h/o VWD.  Has had intercourse since IUD placement, partner cannot feel strings.   The following portions of the patient's history were reviewed and updated as appropriate: allergies, current medications, past family history, past medical history, past social history, past surgical history and problem list. Last pap smear on 03/06/2023 was abnormal, positive HRHPV.  Review of Systems:  Pertinent items are noted in HPI.  Objective:  Physical Exam Blood pressure 114/76, pulse 76, resp. rate 16, height 5\' 8"  (1.727 m), weight 180 lb 14.4 oz (82.1 kg). CONSTITUTIONAL: Well-developed, well-nourished female in no acute distress.  NEUROLOGIC: Alert and oriented to person, place, and time. Normal reflexes, muscle tone coordination.  ABDOMEN: Soft, no distention noted.   PELVIC: Normal appearing external genitalia; normal appearing vaginal mucosa and cervix.  Scant spotting noted, IUD strings visualized, about 2 cm in length outside cervix. Done in the presence of a chaperone.  EXTREMITIES: Non-tender, no edema or cyanosis  Assessment & Plan:   1. IUD check up   2. Dysfunctional uterine bleeding   3. Von Willebrand disease (HCC)      - Patient to keep IUD in place for up to 8 years; can come in for removal if she desires pregnancy earlier or for any concerning side effects.  Also given an additional 2 month supply of OCPS (Balcotra) as it can take up to 3 months for cycles to either regulate or become amenorrheic, and with patient's h/o VWD, she may be one that has more heavy bleeding during that time.  Patient notes understanding.     Hildred Laser, MD [facility]

## 2023-04-19 ENCOUNTER — Ambulatory Visit: Payer: BC Managed Care – PPO | Admitting: Obstetrics and Gynecology

## 2023-04-19 ENCOUNTER — Encounter: Payer: Self-pay | Admitting: Obstetrics and Gynecology

## 2023-04-19 VITALS — BP 114/76 | HR 76 | Resp 16 | Ht 68.0 in | Wt 180.9 lb

## 2023-04-19 DIAGNOSIS — Z30431 Encounter for routine checking of intrauterine contraceptive device: Secondary | ICD-10-CM | POA: Diagnosis not present

## 2023-04-19 DIAGNOSIS — D6800 Von Willebrand disease, unspecified: Secondary | ICD-10-CM | POA: Diagnosis not present

## 2023-04-19 DIAGNOSIS — N938 Other specified abnormal uterine and vaginal bleeding: Secondary | ICD-10-CM | POA: Diagnosis not present

## 2023-06-03 ENCOUNTER — Ambulatory Visit
Admission: EM | Admit: 2023-06-03 | Discharge: 2023-06-03 | Disposition: A | Discharge status: Home / Self Care | Payer: BC Managed Care – PPO | Attending: Physician Assistant | Admitting: Physician Assistant

## 2023-06-03 ENCOUNTER — Encounter: Payer: Self-pay | Admitting: Emergency Medicine

## 2023-06-03 DIAGNOSIS — N10 Acute tubulo-interstitial nephritis: Secondary | ICD-10-CM

## 2023-06-03 DIAGNOSIS — N760 Acute vaginitis: Secondary | ICD-10-CM | POA: Diagnosis not present

## 2023-06-03 DIAGNOSIS — R300 Dysuria: Secondary | ICD-10-CM

## 2023-06-03 LAB — WET PREP, GENITAL
Trich, Wet Prep: NONE SEEN
WBC, Wet Prep HPF POC: <10 — AB (ref <10)

## 2023-06-03 LAB — URINALYSIS, W/ REFLEX TO CULTURE (INFECTION SUSPECTED)
Bilirubin Urine: NEGATIVE
Glucose, UA: NEGATIVE mg/dL
Nitrite: POSITIVE — AB
Protein, ur: 100 mg/dL — AB
RBC / HPF: >50 RBC/hpf (ref 0–5)
Specific Gravity, Urine: 1.025 (ref 1.005–1.030)
pH: 5.5 (ref 5.0–8.0)

## 2023-06-03 MED ORDER — CIPROFLOXACIN HCL 500 MG PO TABS: 500.0000 mg | ORAL_TABLET | Freq: Two times a day (BID) | ORAL | 0 refills | Status: AC

## 2023-06-03 MED ORDER — METRONIDAZOLE 500 MG PO TABS: 500.0000 mg | ORAL_TABLET | Freq: Two times a day (BID) | ORAL | 0 refills | Status: AC

## 2023-06-03 MED ORDER — FLUCONAZOLE 150 MG PO TABS: ORAL_TABLET | ORAL | 0 refills | Status: AC

## 2023-06-03 NOTE — Discharge Instructions (Signed)
UTI: Based on either symptoms or urinalysis, you may have a urinary tract infection. We will send the urine for culture and call with results in a few days. Begin antibiotics at this time. Your symptoms should be much improved over the next 2-3 days. Increase rest and fluid intake. If for some reason symptoms are worsening or not improving after a couple of days or the urine culture determines the antibiotics you are taking will not treat the infection, the antibiotics may be changed. Return or go to ER for fever, back pain, worsening urinary pain, discharge, increased blood in urine. May take Tylenol or Motrin OTC for pain relief or consider AZO if no contraindications   The most common types of vaginal infections are yeast infections and bacterial vaginosis. Neither of which are really considered to be sexually transmitted. Often a pH swab or wet prep is performed and if abnormal may reveal either type of infection. Begin metronidazole if prescribed for possible BV infection. If there is concern for yeast infection, fluconazole is often prescribed . Take this as directed. You may also apply topical miconazole (can be purchased OTC) externally for relief of itching. Increase rest and fluid intake. If labs sent out, we will call within 2-5 days with results and amend treatment if necessary. Always try to use pH balanced washes/wipes, urinate after intercourse, stay hydrated, and take probiotics if you are prone to vaginal infections. Return or see PCP or gynecologist for new/worsening infections.

## 2023-06-03 NOTE — ED Triage Notes (Signed)
Patient c/o dysuria and back pain that started  couple days ago.  Patient reports some blood in her urine this morning.

## 2023-06-03 NOTE — ED Provider Notes (Signed)
[facility]    CSN: [id number] Arrival date & time: 06/03/23  1336      History   Chief Complaint Chief Complaint  Patient presents with   Back Pain   Dysuria    HPI Patricia Schwartz is a 22 y.o. female present for right-sided flank pain, frequency and painful urination for the past couple days.  She says about 6 days ago she had 1 really bad day of painful urination but it resolved after 24 hours.  She says she did not think much of it until she started having the right sided flank pain and also noticed blood in the urine.  She has not report any vaginal itching, discharge, odor.  Has been on antibiotics recently for a sinus infection and does not know if she could have gotten a yeast infection.  Has an IUD.  Denies fever, chills, fatigue, nausea/vomiting.  No history of kidney stones or kidney infections.  States she had UTIs when she was younger.  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 Authorizing Provider  ciprofloxacin (CIPRO) 500 MG tablet Take 1 tablet (500 mg total) by mouth every 12 (twelve) hours for 7 days. 06/03/23 06/10/23 Yes Shirlee Latch, PA-C  fluconazole  (DIFLUCAN) 150 MG tablet Take 1 tab po q72 h 06/03/23  Yes Shirlee Latch, PA-C  FLUoxetine (PROZAC) 20 MG capsule Take 20 mg by mouth every morning. 11/13/20  Yes [provider]  levonorgestrel (MIRENA) 20 MCG/DAY IUD 1 each by Intrauterine route once.   Yes [provider]  metroNIDAZOLE (FLAGYL) 500 MG tablet Take 1 tablet (500 mg total) by mouth 2 (two) times daily for 7 days. 06/03/23 06/10/23 Yes Shirlee Latch, PA-C  desmopressin (DDAVP RHINAL TUBE) 0.01 % SOLN Place 1 drop into the nose daily. As directed. 03/31/21   [name], MD  Norethindrone Acetate-Ethinyl Estrad-FE (LOESTRIN 24 FE) 1-20 MG-MCG(24) tablet Take 1 tablet by mouth at bedtime. 04/11/23 08/01/23  Hildred Laser, MD    Family History Family History  Problem Relation Age of Onset   Lung cancer Maternal Grandfather     Social History Social History   Tobacco Use   Smoking status: Never   Smokeless tobacco: Never  Vaping Use   Vaping status: Never Used  Substance Use Topics   Alcohol use: No   Drug use: Never     Allergies   Patient has no  known allergies.   Review of Systems Review of Systems  Constitutional:  Negative for chills, fatigue and fever.  Gastrointestinal:  Negative for abdominal pain, diarrhea, nausea and vomiting.  Genitourinary:  Positive for dysuria, flank pain, frequency, hematuria and urgency. Negative for decreased urine volume, pelvic pain, vaginal bleeding, vaginal discharge and vaginal pain.  Musculoskeletal:  Positive for back pain.  Skin:  Negative for rash.     Physical Exam Triage Vital Signs ED Triage Vitals  Encounter Vitals Group     BP      Systolic BP Percentile      Diastolic BP Percentile      Pulse      Resp      Temp      Temp src      SpO2      Weight      Height      Head Circumference      Peak Flow      Pain Score      Pain Loc      Pain Education      Exclude from Growth Chart    No data found.  Updated Vital  Signs BP (!) 118/97 (BP Location: Left Arm)   Pulse 72   Temp 98.2 F (36.8 C) (Oral)   Resp 14   Ht 5\' 8"  (1.727 m)   Wt 175 lb (79.4 kg)   SpO2 100%   BMI 26.61 kg/m      Physical Exam Vitals and nursing note reviewed.  Constitutional:      General: She is not in acute distress.    Appearance: Normal appearance. She is not ill-appearing or toxic-appearing.  HENT:     Head: Normocephalic and atraumatic.  Eyes:     General: No scleral icterus.       Right eye: No discharge.        Left eye: No discharge.     Conjunctiva/sclera: Conjunctivae normal.  Cardiovascular:     Rate and Rhythm: Normal rate and regular rhythm.     Heart sounds: Normal heart sounds.  Pulmonary:     Effort: Pulmonary effort is normal. No respiratory distress.     Breath sounds: Normal breath sounds.  Abdominal:     Palpations: Abdomen is soft.     Tenderness: There is abdominal tenderness (generalized). There is no right CVA tenderness or left CVA tenderness.  Musculoskeletal:     Cervical back: Neck supple.  Skin:    General: Skin is dry.  Neurological:     General: No focal deficit present.     Mental Status: She is alert. Mental status is at baseline.     Motor: No weakness.     Gait: Gait normal.  Psychiatric:        Mood and Affect: Mood normal.        Behavior: Behavior normal.        Thought Content: Thought content normal.      UC Treatments / Results  Labs (all labs ordered are listed, but only abnormal results are displayed) Labs Reviewed  WET PREP, GENITAL - Abnormal; Notable for the following components:      Result Value   Yeast Wet Prep HPF POC PRESENT (*)    Clue Cells Wet Prep HPF POC PRESENT (*)    WBC, Wet Prep HPF POC <10 (*)    All other components within normal limits  URINALYSIS, W/ REFLEX TO CULTURE (INFECTION SUSPECTED) - Abnormal; Notable for the  following components:   APPearance HAZY (*)    Hgb urine dipstick MODERATE (*)    Ketones, ur TRACE (*)     Protein, ur 100 (*)    Nitrite POSITIVE (*)    Leukocytes,Ua SMALL (*)    Bacteria, UA MANY (*)    All other components within normal limits  URINE CULTURE    EKG   Radiology No results found.  Procedures Procedures (including critical care time)  Medications Ordered in UC Medications - No data to display  Initial Impression / Assessment and Plan / UC Course  I have reviewed the triage vital signs and the nursing notes.  Pertinent labs & imaging results that were available during my care of the patient were reviewed by me and considered in my medical decision making (see chart for details).   22 year old female presents for right flank pain, painful urination, hematuria.  Symptoms have been ongoing for a couple of days but a few days before that she had 1 day history of "the worst UTIs ever had."  Denies fever, chills, vaginal discharge or concern for STIs.  She is afebrile and overall well-appearing.  Abdomen soft with generalized tenderness.  No CVA tenderness.  Wet prep and urinalysis obtained.  Wet prep is positive for yeast and BV.  Urinalysis shows hazy urine with moderate hemoglobin, trace ketones, protein, positive nitrites, small leukocytes and many bacteria.  Will send urine for culture.  Suspect possible ascending urinary tract infection.  Will cover at this time with Cipro.  Will alter treatment based on culture if needed.  Also treating for vaginal infections with metronidazole and Diflucan.  Supportive care encouraged with increasing rest of fluids.  Advised to return or go to ER for fever, worsening flank pain or worsening urinary symptoms.   Final Clinical Impressions(s) / UC Diagnoses   Final diagnoses:  Acute pyelonephritis  Dysuria  Acute vaginitis     Discharge Instructions      UTI: Based on either symptoms or urinalysis, you may have a urinary tract infection. We will send the urine for culture and call with results in a few days. Begin antibiotics  at this time. Your symptoms should be much improved over the next 2-3 days. Increase rest and fluid intake. If for some reason symptoms are worsening or not improving after a couple of days or the urine culture determines the antibiotics you are taking will not treat the infection, the antibiotics may be changed. Return or go to ER for fever, back pain, worsening urinary pain, discharge, increased blood in urine. May take Tylenol or Motrin OTC for pain relief or consider AZO if no contraindications   The most common types of vaginal infections are yeast infections and bacterial vaginosis. Neither of which are really considered to be sexually transmitted. Often a pH swab or wet prep is performed and if abnormal may reveal either type of infection. Begin metronidazole if prescribed for possible BV infection. If there is concern for yeast infection, fluconazole is often prescribed . Take this as directed. You may also apply topical miconazole (can be purchased OTC) externally for relief of itching. Increase rest and fluid intake. If labs sent out, we will call within 2-5 days with results and amend treatment if necessary. Always try to use pH balanced washes/wipes, urinate after intercourse, stay hydrated, and take probiotics if you are prone to vaginal infections. Return or see PCP or gynecologist for new/worsening infections.       ED Prescriptions  Medication Sig Dispense Auth. Provider   ciprofloxacin (CIPRO) 500 MG tablet Take 1 tablet (500 mg total) by mouth every 12 (twelve) hours for 7 days. 14 tablet Eusebio Friendly B, PA-C   metroNIDAZOLE (FLAGYL) 500 MG tablet Take 1 tablet (500 mg total) by mouth 2 (two) times daily for 7 days. 14 tablet Eusebio Friendly B, PA-C   fluconazole (DIFLUCAN) 150 MG tablet Take 1 tab po q72 h 3 tablet Shirlee Latch, PA-C      PDMP not reviewed this encounter.   Shirlee Latch, PA-C 06/03/23 1502

## 2023-06-05 LAB — URINE CULTURE: Culture: >=100000 — AB

## 2023-06-25 ENCOUNTER — Encounter: Payer: Self-pay | Admitting: Obstetrics and Gynecology

## 2023-06-27 ENCOUNTER — Ambulatory Visit: Payer: BC Managed Care – PPO | Admitting: Certified Nurse Midwife

## 2023-07-30 ENCOUNTER — Other Ambulatory Visit: Payer: Self-pay | Admitting: Obstetrics and Gynecology

## 2023-07-30 DIAGNOSIS — D6800 Von Willebrand disease, unspecified: Secondary | ICD-10-CM

## 2023-07-30 DIAGNOSIS — N921 Excessive and frequent menstruation with irregular cycle: Secondary | ICD-10-CM

## 2023-07-30 DIAGNOSIS — N938 Other specified abnormal uterine and vaginal bleeding: Secondary | ICD-10-CM

## 2023-10-20 ENCOUNTER — Encounter: Payer: Self-pay | Admitting: Obstetrics and Gynecology
